# Patient Record
Sex: Male | Born: 1985
Health system: Southern US, Community
[De-identification: ages and names within clinical notes are randomized; demographics above are authoritative.]

## PROBLEM LIST (undated history)

## (undated) DIAGNOSIS — S32009A Unspecified fracture of unspecified lumbar vertebra, initial encounter for closed fracture: Secondary | ICD-10-CM

## (undated) HISTORY — DX: Unspecified fracture of unspecified lumbar vertebra, initial encounter for closed fracture: S32.009A

---
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
## 2007-12-20 ENCOUNTER — Ambulatory Visit: Payer: Self-pay | Admitting: Internal Medicine

## 2010-03-19 ENCOUNTER — Emergency Department: Payer: Self-pay | Admitting: Internal Medicine

## 2011-10-15 IMAGING — CR DG CHEST 1V PORT
1 series · 1 of 1 positions shown · non-contrast
Comparison: none

REASON FOR EXAM: sob
COMMENTS:

PROCEDURE:     DXR - DXR PORTABLE CHEST SINGLE VIEW  - March 19, 2010  [DATE]
RESULT:     Comparison: None

[view not recorded]
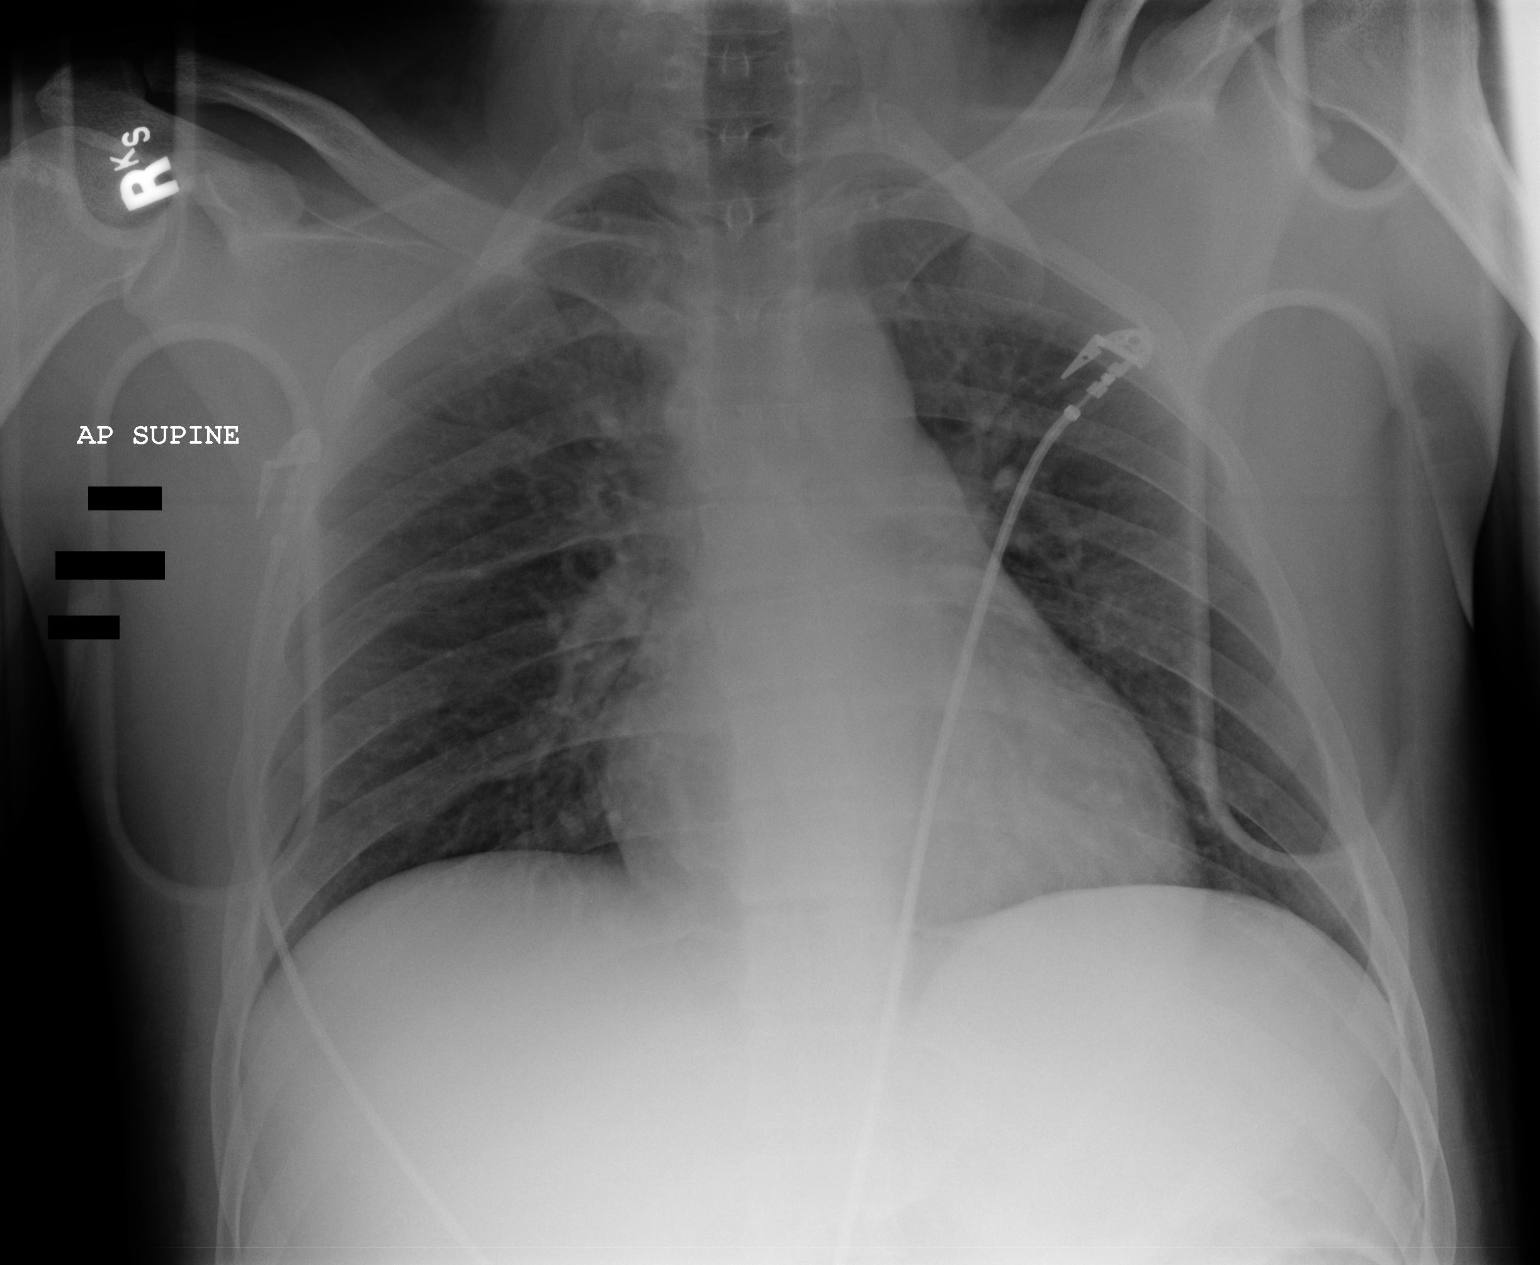

[1 of 1 positions shown; findings below may reference images not displayed]

FINDINGS: Single portable AP chest radiograph is provided. There is no focal
parenchymal opacity, pleural effusion, or pneumothorax. Normal
cardiomediastinal silhouette. The osseous structures are unremarkable.
IMPRESSION: No acute disease of the chest.

## 2011-10-15 IMAGING — CT CT CERVICAL SPINE WITHOUT CONTRAST
3 series · 16 of 35 positions shown, 19 images · non-contrast
Comparison: None

REASON FOR EXAM: fall neck apin
COMMENTS:

PROCEDURE:     CT  - CT CERVICAL SPINE WO  - March 19, 2010  [DATE]
RESULT:     Clinical Indication:
TECHNIQUE: Multiple axial CT images from the skull base to the mid vertebral
body of T1. obtained with sagittal and coronal reformatted images provided.

[Series 3: coronal · coronal · 0.32mm/px · 3 of 46 slices shown]
[im 10/46  bone]
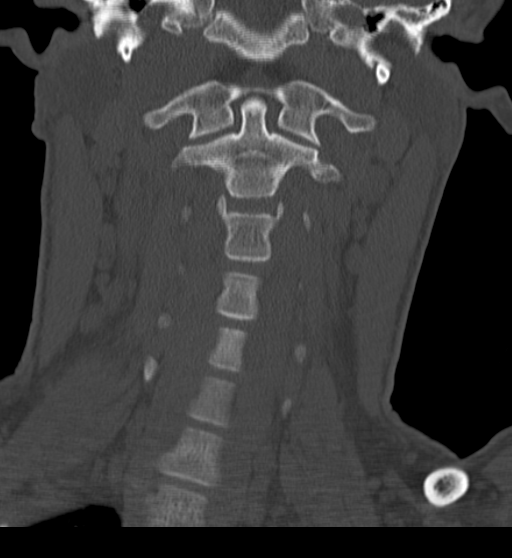
[im 19/46  bone]
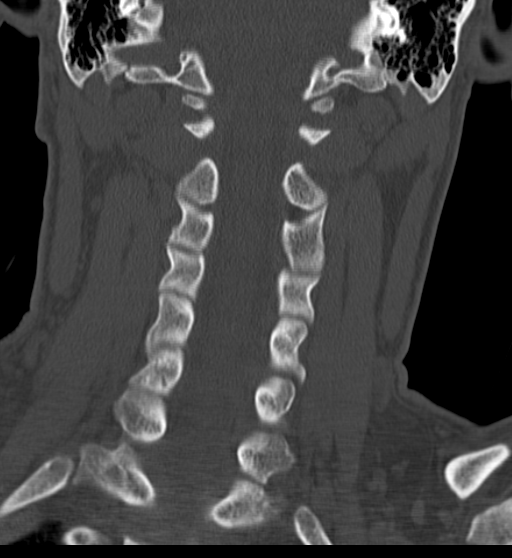
[im 28/46  bone]
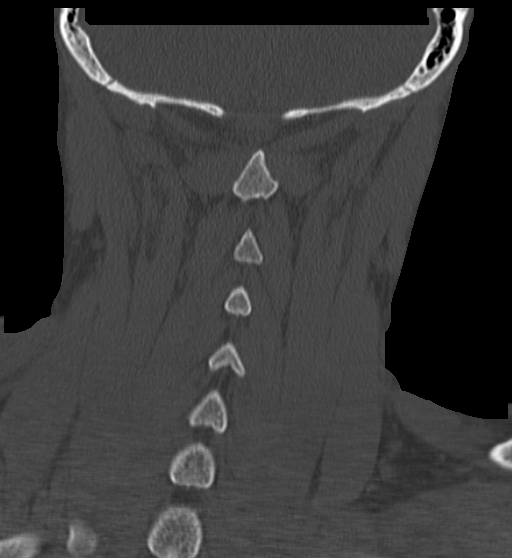

[Series 4: sagittal · sagittal · 0.36mm/px · 5 of 57 slices shown, 6 images]
[im 19/57  bone]
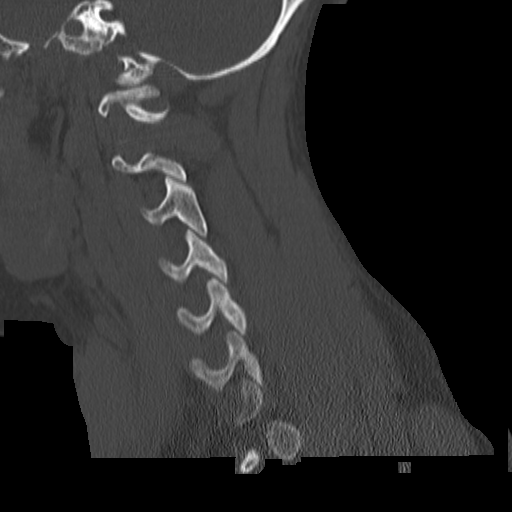
[im 24/57  bone]
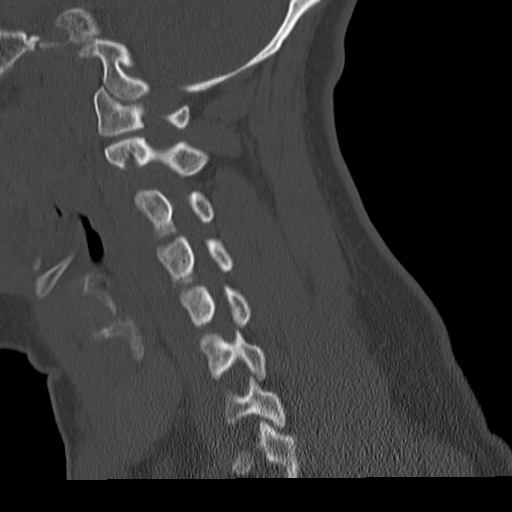
[im 29/57  bone]
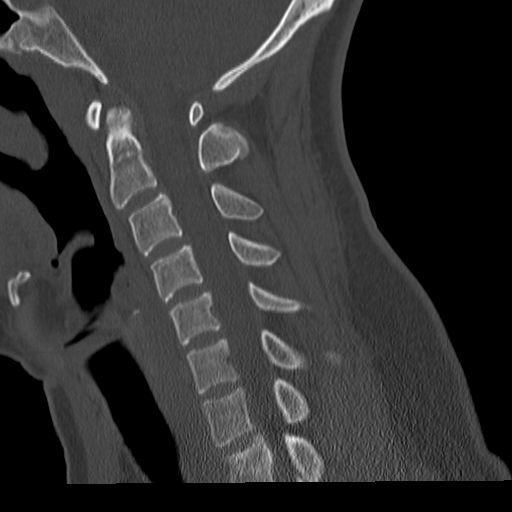
[im 33/57  soft-tissue]
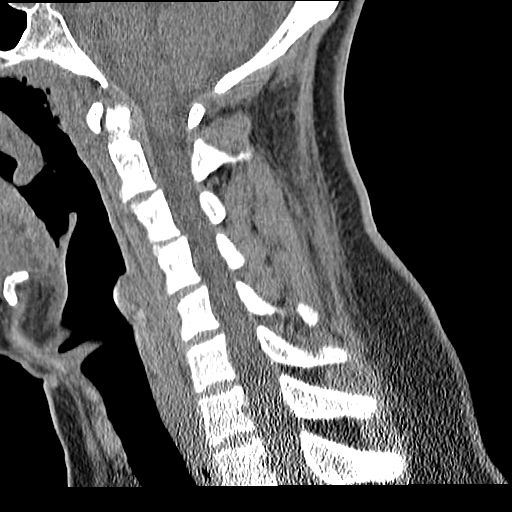
[im 33/57  bone]
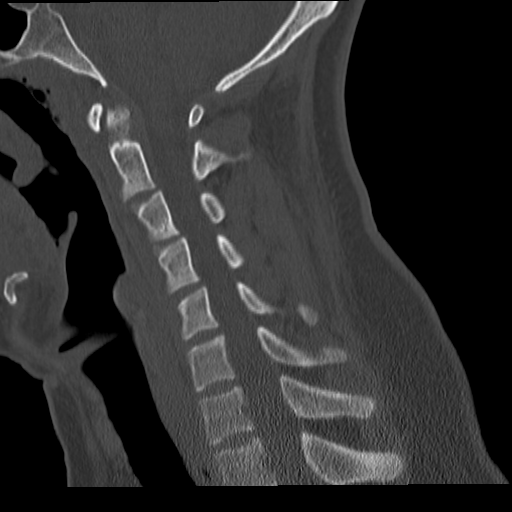
[im 38/57  bone]
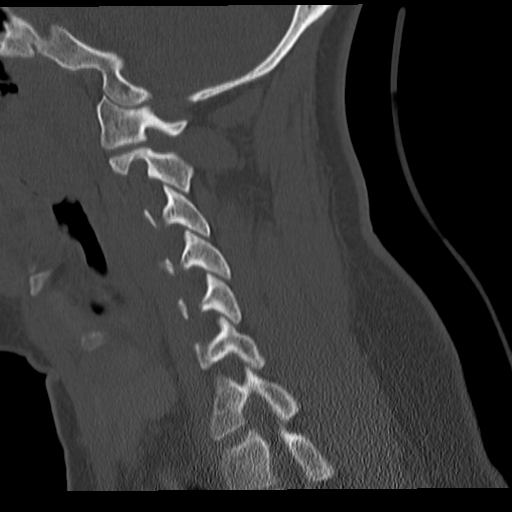

[Series 5: axial · axial · 0.29mm/px · z∈[-75,+64]mm · 8 of 84 slices shown, 10 images]
[im 7/84  soft-tissue]
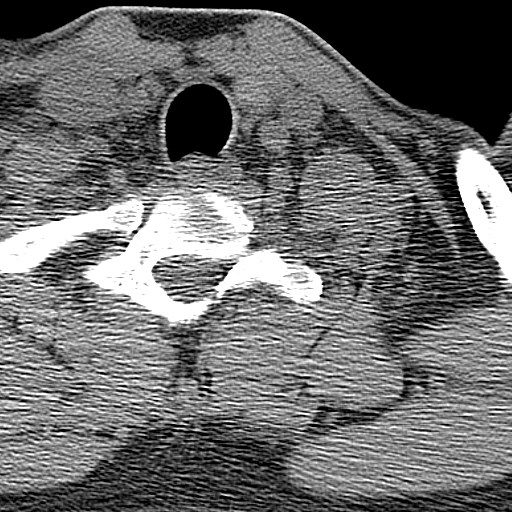
[im 7/84  bone]
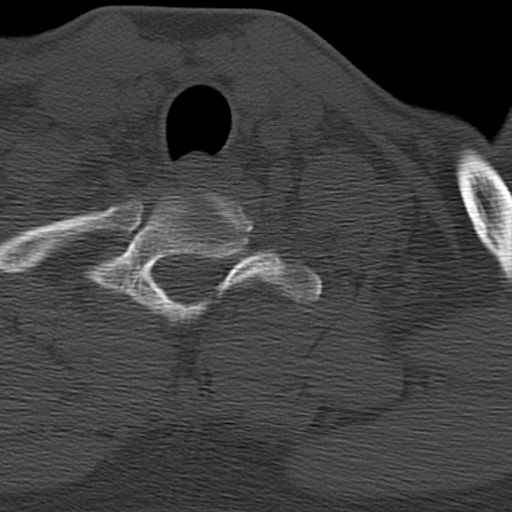
[im 20/84  bone]
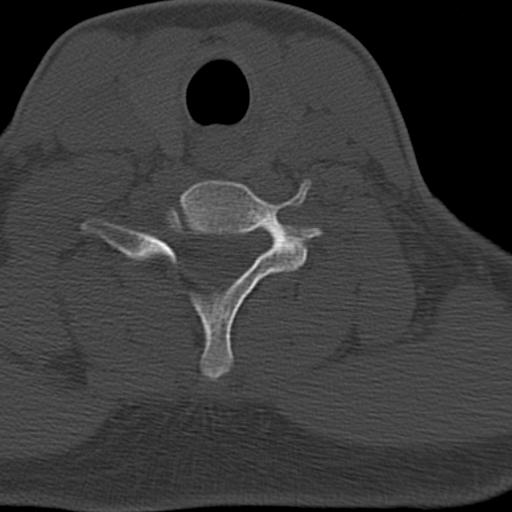
[im 26/84  bone]
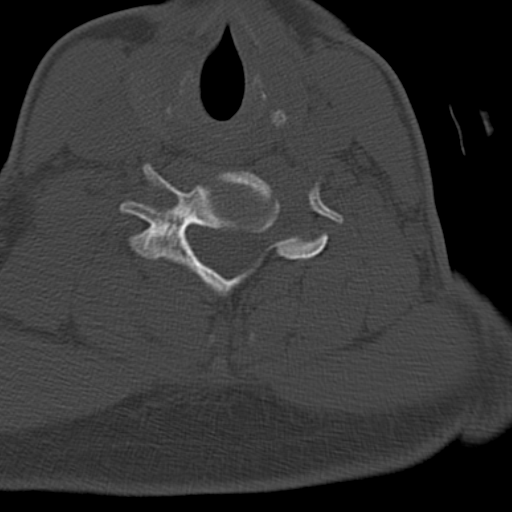
[im 39/84  bone]
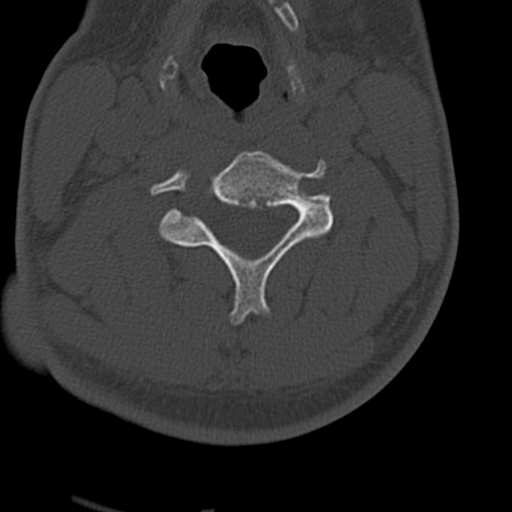
[im 45/84  soft-tissue]
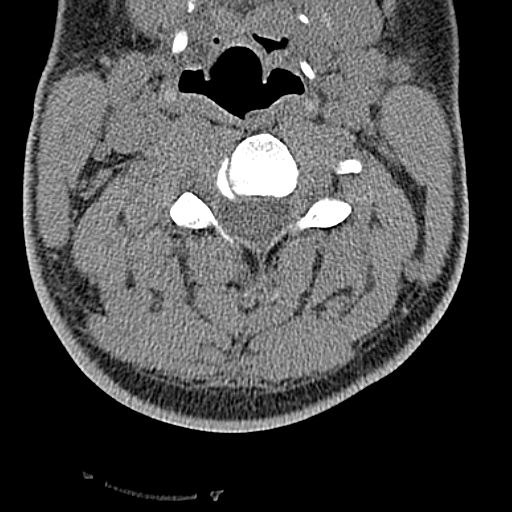
[im 45/84  bone]
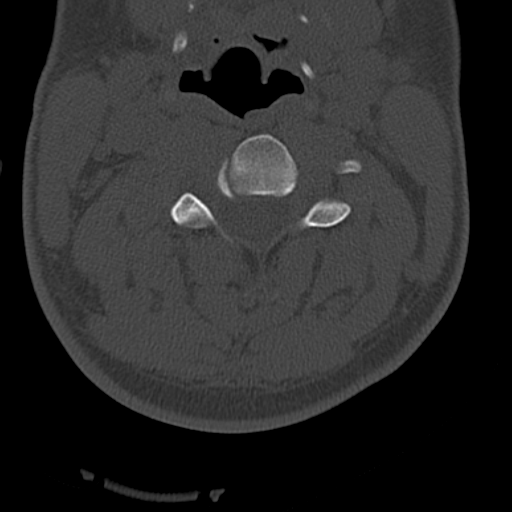
[im 58/84  bone]
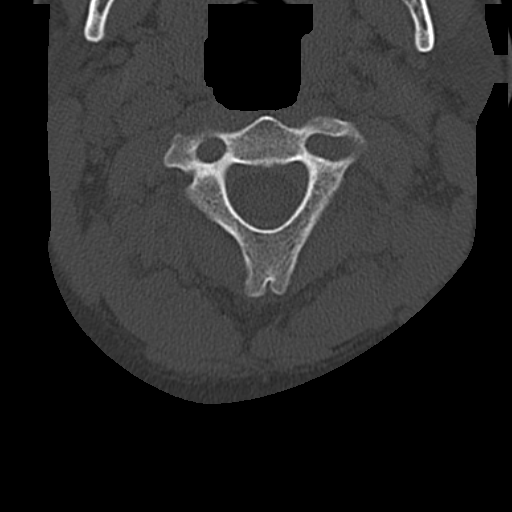
[im 64/84  bone]
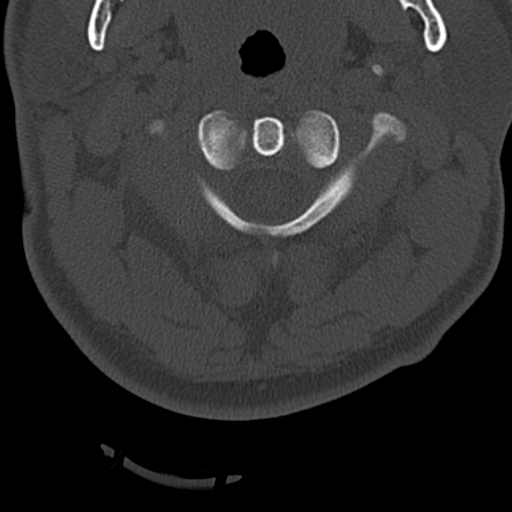
[im 77/84  bone]
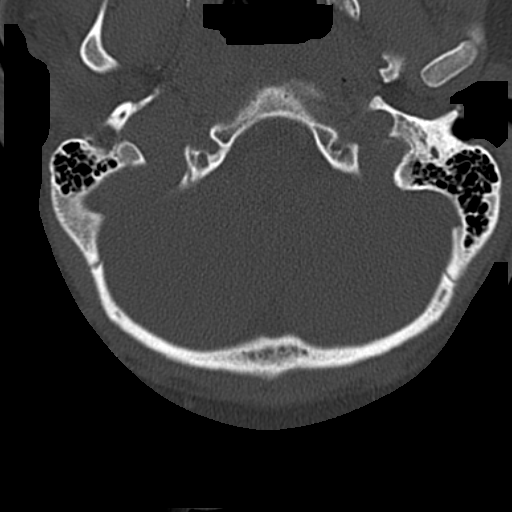

[16 of 35 positions shown; findings below may reference images not displayed]

FINDINGS: The alignment is anatomic. The vertebral body heights are maintained. There
is loss of the normal cervical lordosis with relative straightening which
may be secondary to muscle spasm. There is no acute fracture or static
listhesis. The prevertebral soft tissues are normal. The intraspinal soft
tissues are not fully imaged on this examination due to poor soft tissue
contrast, but there is no soft tissue gross abnormality.

The disc spaces are maintained.
IMPRESSION: 1. No acute osseous injury of the cervical spine.

2. Ligamentous injury is not evaluated. If there is high clinical concern
for ligamentous injury, consider MRI or flexion/extension radiographs as
clinically indicated and tolerated.

## 2012-12-18 DIAGNOSIS — S32009A Unspecified fracture of unspecified lumbar vertebra, initial encounter for closed fracture: Secondary | ICD-10-CM

## 2012-12-18 HISTORY — PX: BACK SURGERY: SHX140

## 2012-12-18 HISTORY — DX: Unspecified fracture of unspecified lumbar vertebra, initial encounter for closed fracture: S32.009A

## 2017-12-25 DIAGNOSIS — J22 Unspecified acute lower respiratory infection: Secondary | ICD-10-CM | POA: Diagnosis not present

## 2018-05-06 ENCOUNTER — Ambulatory Visit: Payer: BLUE CROSS/BLUE SHIELD | Admitting: Podiatry

## 2018-05-06 ENCOUNTER — Encounter: Payer: Self-pay | Admitting: Podiatry

## 2018-05-06 ENCOUNTER — Other Ambulatory Visit: Payer: Self-pay | Admitting: Podiatry

## 2018-05-06 ENCOUNTER — Ambulatory Visit (INDEPENDENT_AMBULATORY_CARE_PROVIDER_SITE_OTHER): Payer: BLUE CROSS/BLUE SHIELD

## 2018-05-06 VITALS — BP 136/88 | HR 83 | Resp 16

## 2018-05-06 DIAGNOSIS — M722 Plantar fascial fibromatosis: Secondary | ICD-10-CM | POA: Diagnosis not present

## 2018-05-06 DIAGNOSIS — M79671 Pain in right foot: Secondary | ICD-10-CM

## 2018-05-06 DIAGNOSIS — M79672 Pain in left foot: Principal | ICD-10-CM

## 2018-05-06 MED ORDER — PREDNISONE 10 MG PO TABS: ORAL_TABLET | ORAL | 0 refills | Status: DC

## 2018-05-06 MED ORDER — TRIAMCINOLONE ACETONIDE 10 MG/ML IJ SUSP
10.0000 mg | Freq: Once | INTRAMUSCULAR | Status: AC
  Administered 2018-05-06: 10 mg

## 2018-05-06 NOTE — Progress Notes (Signed)
   Subjective:    Patient ID: Kevin Mendoza, male    DOB: 1986-06-06, 32 y.o.   MRN: [id number]  HPI    Review of Systems  All other systems reviewed and are negative.      Objective:   Physical Exam        Assessment & Plan:

## 2018-05-06 NOTE — Patient Instructions (Signed)

## 2018-05-20 ENCOUNTER — Ambulatory Visit: Payer: BLUE CROSS/BLUE SHIELD | Admitting: Podiatry

## 2018-05-20 ENCOUNTER — Encounter: Payer: Self-pay | Admitting: Podiatry

## 2018-05-20 DIAGNOSIS — M722 Plantar fascial fibromatosis: Secondary | ICD-10-CM

## 2018-05-20 MED ORDER — TRIAMCINOLONE ACETONIDE 10 MG/ML IJ SUSP
10.0000 mg | Freq: Once | INTRAMUSCULAR | Status: AC
  Administered 2018-05-20: 10 mg

## 2018-05-20 NOTE — Progress Notes (Signed)
Subjective:   Patient ID: Kevin Mendoza, male   DOB: 32 y.o.   MRN: [id number]   HPI Patient states the brace is helping and the injection seems to help but I still have pain and I am so active in my job and I do have to bend down a lot and I have to squat quite a bit   ROS      Objective:  Physical Exam  Neurovascular status with patient's plantar heels still sore upon deep palpation but improved from previous visit with moderate depression of the arch noted     Assessment:  Continued acute plantar fasciitis bilateral     Plan:  H&P condition reviewed and today I reinjected the plantar fascial bilateral 3 mg Kenalog 5 mg Xylocaine and I advised this patient on anti-inflammatories and I went ahead and I scanned for customized orthotic devices to reduce the plantar pressure on the heels.  Reappoint when returned or earlier if needed

## 2018-07-08 ENCOUNTER — Ambulatory Visit: Payer: BLUE CROSS/BLUE SHIELD | Admitting: Family Medicine

## 2018-07-08 ENCOUNTER — Encounter: Payer: Self-pay | Admitting: Family Medicine

## 2018-07-08 VITALS — BP 124/82 | HR 82 | Resp 16 | Ht 70.0 in | Wt 237.0 lb

## 2018-07-08 DIAGNOSIS — L2082 Flexural eczema: Secondary | ICD-10-CM

## 2018-07-08 DIAGNOSIS — Z6833 Body mass index (BMI) 33.0-33.9, adult: Secondary | ICD-10-CM | POA: Diagnosis not present

## 2018-07-08 DIAGNOSIS — Z7689 Persons encountering health services in other specified circumstances: Secondary | ICD-10-CM | POA: Diagnosis not present

## 2018-07-08 DIAGNOSIS — R5383 Other fatigue: Secondary | ICD-10-CM | POA: Diagnosis not present

## 2018-07-08 DIAGNOSIS — E6609 Other obesity due to excess calories: Secondary | ICD-10-CM | POA: Diagnosis not present

## 2018-07-08 MED ORDER — TRIAMCINOLONE ACETONIDE 0.1 % EX CREA: 1.0000 "application " | TOPICAL_CREAM | Freq: Two times a day (BID) | CUTANEOUS | 1 refills | Status: DC

## 2018-07-08 NOTE — Progress Notes (Signed)
Name: Kevin Mendoza   MRN: [id number]    DOB: 30-Mar-1986   Date:07/08/2018       Progress Note  Subjective  Chief Complaint  Chief Complaint  Patient presents with  . Establish Care    Patient is a 32 year old male who presents to establish care with primary care physician.. The patient reports the following problems: none. Health maintenance has been reviewed up to date.   No problem-specific Assessment & Plan notes found for this encounter.   Past Medical History:  Diagnosis Date  . Fracture of lumbar spine (HCC) 2014    Past Surgical History:  Procedure Laterality Date  . BACK SURGERY  2014    Family History  Problem Relation Age of Onset  . Obesity Mother   . Heart failure Father   . Heart failure Brother     Social History   Socioeconomic History  . Marital status: Single    Spouse name: Not on file  . Number of children: Not on file  . Years of education: Not on file  . Highest education level: Not on file  Occupational History  . Not on file  Social Needs  . Financial resource strain: Not on file  . Food insecurity:    Worry: Not on file    Inability: Not on file  . Transportation needs:    Medical: Not on file    Non-medical: Not on file  Tobacco Use  . Smoking status: Former Smoker    Types: Cigarettes  . Smokeless tobacco: Never Used  Substance and Sexual Activity  . Alcohol use: Yes    Comment: occassionally   . Drug use: Never  . Sexual activity: Not on file  Lifestyle  . Physical activity:    Days per week: Not on file    Minutes per session: Not on file  . Stress: Not on file  Relationships  . Social connections:    Talks on phone: Not on file    Gets together: Not on file    Attends religious service: Not on file    Active member of club or organization: Not on file    Attends meetings of clubs or organizations: Not on file    Relationship status: Not on file  . Intimate partner violence:    Fear of current or ex partner: Not  on file    Emotionally abused: Not on file    Physically abused: Not on file    Forced sexual activity: Not on file  Other Topics Concern  . Not on file  Social History Narrative  . Not on file    Allergies  Allergen Reactions  . Amoxicillin     Outpatient Medications Prior to Visit  Medication Sig Dispense Refill  . predniSONE (DELTASONE) 10 MG tablet 12 day tapering dose 48 tablet 0   No facility-administered medications prior to visit.     Review of Systems  Constitutional: Positive for malaise/fatigue. Negative for chills, fever and weight loss.  HENT: Negative for congestion, ear discharge, ear pain, sinus pain and sore throat.   Eyes: Negative for blurred vision, photophobia, pain and discharge.  Respiratory: Negative for cough, hemoptysis, sputum production, shortness of breath and wheezing.   Cardiovascular: Negative for chest pain, palpitations, orthopnea, claudication, leg swelling and PND.  Gastrointestinal: Negative for abdominal pain, blood in stool, constipation, diarrhea, heartburn, melena and nausea.  Genitourinary: Negative for dysuria, frequency, hematuria and urgency.  Musculoskeletal: Negative for back pain, falls, joint pain,  myalgias and neck pain.  Skin: Negative for rash.       eczema  Neurological: Negative for dizziness, tingling, sensory change, focal weakness and headaches.  Endo/Heme/Allergies: Negative for environmental allergies and polydipsia. Does not bruise/bleed easily.  Psychiatric/Behavioral: Negative for depression and suicidal ideas. The patient is not nervous/anxious and does not have insomnia.        Libido     Objective  Vitals:   07/08/18 1436  BP: 124/82  Pulse: 82  Resp: 16  SpO2: 98%  Weight: 237 lb (107.5 kg)  Height: 5\' 10"  (1.778 m)    Physical Exam  Constitutional: He is oriented to person, place, and time.  HENT:  Head: Normocephalic.  Right Ear: Hearing, external ear and ear canal normal. Right ear retracted  TM: triamcin. A middle ear effusion is present.  Left Ear: Hearing, external ear and ear canal normal. A middle ear effusion is present.  Nose: Nose normal.  Mouth/Throat: Oropharynx is clear and moist.  Eyes: Pupils are equal, round, and reactive to light. Conjunctivae and EOM are normal. Right eye exhibits no discharge. Left eye exhibits no discharge. No scleral icterus.  Neck: Normal range of motion. Neck supple. Normal carotid pulses, no hepatojugular reflux and no JVD present. Carotid bruit is not present. No tracheal deviation present. Thyromegaly present. No thyroid mass present.  Cardiovascular: Normal rate, regular rhythm, S1 normal, S2 normal and intact distal pulses. Exam reveals no gallop, no S3, no S4 and no friction rub.  Murmur heard.  Systolic murmur is present with a grade of 1/6.  No diastolic murmur is present. Pulmonary/Chest: Breath sounds normal. No respiratory distress. He has no wheezes. He has no rales.  Abdominal: Soft. Bowel sounds are normal. He exhibits no mass. There is no hepatosplenomegaly. There is no tenderness. There is no rebound, no guarding and no CVA tenderness.  Musculoskeletal: Normal range of motion. He exhibits no edema or tenderness.  Lymphadenopathy:    He has no cervical adenopathy.  Neurological: He is alert and oriented to person, place, and time. He has normal strength and normal reflexes. No cranial nerve deficit.  Skin: Skin is warm. No rash noted.  Nursing note and vitals reviewed.     Assessment & Plan  Problem List Items Addressed This Visit    None    Visit Diagnoses    Establishing care with new doctor, encounter for    -  Primary   Patient to establish care.   Fatigue, unspecified type       Patient withconcern of family hx of cardiomyopathy. Concern of fatigue and decreased libido. Will check cbc,renal,lipid, tsh, and testosterone.   Relevant Orders   TSH   Renal Function Panel   Testosterone   CBC with  Differential/Platelet   Class 1 obesity due to excess calories without serious comorbidity with body mass index (BMI) of 33.0 to 33.9 in adult       Discussed weight loss. CPE in 2-3 months   Relevant Orders   TSH   Lipid panel   Flexural eczema       Recurrent Will prescibe triamcinolone 0.1% bid    Relevant Medications   triamcinolone cream (KENALOG) 0.1 %      Meds ordered this encounter  Medications  . triamcinolone cream (KENALOG) 0.1 %    Sig: Apply 1 application topically 2 (two) times daily.    Dispense:  453.6 g    Refill:  1      Dr. Jennette Kettle  [facility]  07/08/18

## 2018-07-09 DIAGNOSIS — Z6833 Body mass index (BMI) 33.0-33.9, adult: Secondary | ICD-10-CM | POA: Diagnosis not present

## 2018-07-09 DIAGNOSIS — E6609 Other obesity due to excess calories: Secondary | ICD-10-CM | POA: Diagnosis not present

## 2018-07-09 DIAGNOSIS — R5383 Other fatigue: Secondary | ICD-10-CM | POA: Diagnosis not present

## 2018-07-10 LAB — CBC WITH DIFFERENTIAL/PLATELET
BASOS ABS: 0 10*3/uL (ref 0.0–0.2)
BASOS: 0 %
EOS (ABSOLUTE): 0.1 10*3/uL (ref 0.0–0.4)
Eos: 2 %
Hematocrit: 44.7 % (ref 37.5–51.0)
Hemoglobin: 15.1 g/dL (ref 13.0–17.7)
IMMATURE GRANULOCYTES: 0 %
Immature Grans (Abs): 0 10*3/uL (ref 0.0–0.1)
Lymphocytes Absolute: 2.2 10*3/uL (ref 0.7–3.1)
Lymphs: 28 %
MCH: 31.3 pg (ref 26.6–33.0)
MCHC: 33.8 g/dL (ref 31.5–35.7)
MCV: 93 fL (ref 79–97)
MONOS ABS: 0.6 10*3/uL (ref 0.1–0.9)
Monocytes: 8 %
NEUTROS PCT: 62 %
Neutrophils Absolute: 4.9 10*3/uL (ref 1.4–7.0)
PLATELETS: 238 10*3/uL (ref 150–450)
RBC: 4.83 x10E6/uL (ref 4.14–5.80)
RDW: 12.4 % (ref 12.3–15.4)
WBC: 7.9 10*3/uL (ref 3.4–10.8)

## 2018-07-10 LAB — TSH: TSH: 1.25 u[IU]/mL (ref 0.450–4.500)

## 2018-07-10 LAB — LIPID PANEL
CHOL/HDL RATIO: 5 ratio (ref 0.0–5.0)
CHOLESTEROL TOTAL: 179 mg/dL (ref 100–199)
HDL: 36 mg/dL — ABNORMAL LOW (ref >39)
LDL Calculated: 119 mg/dL — ABNORMAL HIGH (ref 0–99)
Triglycerides: 122 mg/dL (ref 0–149)
VLDL Cholesterol Cal: 24 mg/dL (ref 5–40)

## 2018-07-10 LAB — RENAL FUNCTION PANEL
Albumin: 4.7 g/dL (ref 3.5–5.5)
BUN/Creatinine Ratio: 12 (ref 9–20)
BUN: 11 mg/dL (ref 6–20)
CHLORIDE: 102 mmol/L (ref 96–106)
CO2: 21 mmol/L (ref 20–29)
Calcium: 9.4 mg/dL (ref 8.7–10.2)
Creatinine, Ser: 0.94 mg/dL (ref 0.76–1.27)
GFR calc Af Amer: 124 mL/min/{1.73_m2} (ref >59)
GFR calc non Af Amer: 108 mL/min/{1.73_m2} (ref >59)
Glucose: 86 mg/dL (ref 65–99)
Phosphorus: 3.1 mg/dL (ref 2.5–4.5)
Potassium: 4.6 mmol/L (ref 3.5–5.2)
Sodium: 141 mmol/L (ref 134–144)

## 2018-07-10 LAB — TESTOSTERONE: TESTOSTERONE: 457 ng/dL (ref 264–916)

## 2018-08-28 ENCOUNTER — Ambulatory Visit (INDEPENDENT_AMBULATORY_CARE_PROVIDER_SITE_OTHER): Payer: BLUE CROSS/BLUE SHIELD | Admitting: Family Medicine

## 2018-08-28 ENCOUNTER — Encounter: Payer: Self-pay | Admitting: Family Medicine

## 2018-08-28 VITALS — BP 130/82 | HR 64 | Ht 70.0 in | Wt 235.0 lb

## 2018-08-28 DIAGNOSIS — Z8249 Family history of ischemic heart disease and other diseases of the circulatory system: Secondary | ICD-10-CM | POA: Diagnosis not present

## 2018-08-28 DIAGNOSIS — Z1211 Encounter for screening for malignant neoplasm of colon: Secondary | ICD-10-CM

## 2018-08-28 DIAGNOSIS — R5383 Other fatigue: Secondary | ICD-10-CM

## 2018-08-28 DIAGNOSIS — K219 Gastro-esophageal reflux disease without esophagitis: Secondary | ICD-10-CM | POA: Diagnosis not present

## 2018-08-28 DIAGNOSIS — Z0000 Encounter for general adult medical examination without abnormal findings: Secondary | ICD-10-CM | POA: Diagnosis not present

## 2018-08-28 LAB — HEMOCCULT GUIAC POC 1CARD (OFFICE): Fecal Occult Blood, POC: NEGATIVE

## 2018-08-28 NOTE — Progress Notes (Signed)
Name: Kevin Mendoza   MRN: [id number]    DOB: 10-16-1986   Date:08/28/2018       Progress Note  Subjective  Chief Complaint  Chief Complaint  Patient presents with  . Annual Exam    referral to cardiology?- dad and brother had CHF. No issues that he is aware of.  . Gastroesophageal Reflux    takes TUMS for relief- helps    Patient is a 32  year old male who presents for a comprehensive physical exam. The patient reports the following problems: Heart history concern. Health maintenance has been reviewed up to date.   No problem-specific Assessment & Plan notes found for this encounter.   Past Medical History:  Diagnosis Date  . Fracture of lumbar spine (HCC) 2014    Past Surgical History:  Procedure Laterality Date  . BACK SURGERY  2014    Family History  Problem Relation Age of Onset  . Obesity Mother   . Heart failure Father   . Heart failure Brother     Social History   Socioeconomic History  . Marital status: Single    Spouse name: Not on file  . Number of children: Not on file  . Years of education: Not on file  . Highest education level: Not on file  Occupational History  . Not on file  Social Needs  . Financial resource strain: Not on file  . Food insecurity:    Worry: Not on file    Inability: Not on file  . Transportation needs:    Medical: Not on file    Non-medical: Not on file  Tobacco Use  . Smoking status: Former Smoker    Types: Cigarettes  . Smokeless tobacco: Never Used  Substance and Sexual Activity  . Alcohol use: Yes    Comment: occassionally   . Drug use: Never  . Sexual activity: Not on file  Lifestyle  . Physical activity:    Days per week: Not on file    Minutes per session: Not on file  . Stress: Not on file  Relationships  . Social connections:    Talks on phone: Not on file    Gets together: Not on file    Attends religious service: Not on file    Active member of club or organization: Not on file    Attends meetings  of clubs or organizations: Not on file    Relationship status: Not on file  . Intimate partner violence:    Fear of current or ex partner: Not on file    Emotionally abused: Not on file    Physically abused: Not on file    Forced sexual activity: Not on file  Other Topics Concern  . Not on file  Social History Narrative  . Not on file    Allergies  Allergen Reactions  . Amoxicillin     Outpatient Medications Prior to Visit  Medication Sig Dispense Refill  . triamcinolone cream (KENALOG) 0.1 % Apply 1 application topically 2 (two) times daily. 453.6 g 1   No facility-administered medications prior to visit.     Review of Systems  Constitutional: Negative for chills, fever, malaise/fatigue and weight loss.  HENT: Negative for ear discharge, ear pain and sore throat.   Eyes: Negative for blurred vision.  Respiratory: Negative for cough, hemoptysis, sputum production, shortness of breath and wheezing.   Cardiovascular: Negative for chest pain, palpitations, orthopnea, claudication, leg swelling and PND.  Gastrointestinal: Negative for abdominal pain, blood  in stool, constipation, diarrhea, heartburn, melena, nausea and vomiting.  Genitourinary: Negative for dysuria, flank pain, frequency, hematuria and urgency.       No nocturia  Musculoskeletal: Negative for back pain, joint pain, myalgias and neck pain.  Skin: Negative for rash.  Neurological: Negative for dizziness, tingling, sensory change, speech change, focal weakness and headaches.  Endo/Heme/Allergies: Negative for environmental allergies and polydipsia. Does not bruise/bleed easily.  Psychiatric/Behavioral: Negative for depression and suicidal ideas. The patient is not nervous/anxious and does not have insomnia.      Objective  Vitals:   08/28/18 0838  BP: 130/82  Pulse: 64  Weight: 235 lb (106.6 kg)  Height: 5\' 10"  (1.778 m)    Physical Exam  Constitutional: He is oriented to person, place, and time. He  appears well-developed and well-nourished.  HENT:  Head: Normocephalic.  Right Ear: Hearing, tympanic membrane, external ear and ear canal normal.  Left Ear: Hearing, tympanic membrane, external ear and ear canal normal.  Nose: Nose normal. No mucosal edema.  Mouth/Throat: Uvula is midline and oropharynx is clear and moist. No oropharyngeal exudate, posterior oropharyngeal edema or posterior oropharyngeal erythema.  Eyes: Pupils are equal, round, and reactive to light. Conjunctivae, EOM and lids are normal. Right eye exhibits no discharge. Left eye exhibits no discharge. No scleral icterus.  Fundoscopic exam:      The right eye shows no arteriolar narrowing and no AV nicking.       The left eye shows no arteriolar narrowing and no AV nicking.  Neck: Trachea normal, normal range of motion and full passive range of motion without pain. Neck supple. Normal carotid pulses, no hepatojugular reflux and no JVD present. Carotid bruit is not present. No tracheal deviation present. No thyroid mass and no thyromegaly present.  Cardiovascular: Normal rate, regular rhythm, S1 normal, S2 normal, normal heart sounds and intact distal pulses. PMI is not displaced. Exam reveals no gallop, no S3, no S4, no distant heart sounds, no friction rub and no decreased pulses.  No murmur heard.  No systolic murmur is present.  No diastolic murmur is present. Pulses:      Carotid pulses are 2+ on the right side, and 2+ on the left side.      Radial pulses are 2+ on the right side, and 2+ on the left side.       Femoral pulses are 2+ on the right side, and 2+ on the left side.      Popliteal pulses are 2+ on the right side, and 2+ on the left side.       Dorsalis pedis pulses are 2+ on the right side, and 2+ on the left side.       Posterior tibial pulses are 2+ on the right side, and 2+ on the left side.  Pulmonary/Chest: Effort normal and breath sounds normal. No stridor. No respiratory distress. He has no decreased  breath sounds. He has no wheezes. He has no rales. He exhibits no mass. Right breast exhibits no mass. Left breast exhibits no mass.  Abdominal: Soft. Normal aorta and bowel sounds are normal. He exhibits no pulsatile liver, no ascites and no mass. There is no hepatosplenomegaly, splenomegaly or hepatomegaly. There is no tenderness. There is no rebound, no guarding and no CVA tenderness. No hernia. Hernia confirmed negative in the right inguinal area and confirmed negative in the left inguinal area.  Genitourinary: Rectum normal, prostate normal, testes normal and penis normal. Rectal exam shows guaiac negative stool.  Right testis shows no mass, no swelling and no tenderness. Left testis shows no mass, no swelling and no tenderness.  Musculoskeletal: Normal range of motion. He exhibits no edema or tenderness.       Cervical back: Normal.       Thoracic back: Normal.       Lumbar back: Normal.  Lymphadenopathy:       Head (right side): No submental and no submandibular adenopathy present.       Head (left side): No submental and no submandibular adenopathy present.    He has no cervical adenopathy.       Right cervical: No superficial cervical, no deep cervical and no posterior cervical adenopathy present.      Left cervical: No superficial cervical, no deep cervical and no posterior cervical adenopathy present.  Neurological: He is alert and oriented to person, place, and time. He has normal strength and normal reflexes. No cranial nerve deficit or sensory deficit.  Reflex Scores:      Tricep reflexes are 2+ on the right side and 2+ on the left side.      Bicep reflexes are 2+ on the right side and 2+ on the left side.      Brachioradialis reflexes are 2+ on the right side and 2+ on the left side.      Patellar reflexes are 2+ on the right side and 2+ on the left side.      Achilles reflexes are 2+ on the right side and 2+ on the left side. Skin: Skin is warm, dry and intact. No rash noted.   Psychiatric: He has a normal mood and affect. His speech is normal and behavior is normal.  Nursing note and vitals reviewed.     Assessment & Plan  Problem List Items Addressed This Visit    None    Visit Diagnoses    Annual physical exam    -  Primary   no abnormalities on physical exam   Relevant Orders   POCT occult blood stool (Completed)   Gastroesophageal reflux disease, esophagitis presence not specified       wishes to continue with TUMS   Family history of first-degree relative with cardiomyopathy       sched appt with cardio/ spoke to [place] concerning pt   Fatigue, unspecified type       follow up with Dr Gwen Pounds labs were normal at last visit   Colon cancer screening       Relevant Orders   POCT occult blood stool (Completed)      No orders of the defined types were placed in this encounter.     Dr. [facility]  08/28/18

## 2018-08-28 NOTE — Patient Instructions (Signed)
Mediterranean Diet A Mediterranean diet refers to food and lifestyle choices that are based on the traditions of countries located on the [place]. This way of eating has been shown to help prevent certain conditions and improve outcomes for people who have chronic diseases, like kidney disease and heart disease. What are tips for following this plan? Lifestyle  Cook and eat meals together with your family, when possible.  Drink enough fluid to keep your urine clear or pale yellow.  Be physically active every day. This includes: ? Aerobic exercise like running or swimming. ? Leisure activities like gardening, walking, or housework.  Get 7-8 hours of sleep each night.  If recommended by your health care provider, drink red wine in moderation. This means 1 glass a day for nonpregnant women and 2 glasses a day for men. A glass of wine equals 5 oz (150 mL). Reading food labels  Check the serving size of packaged foods. For foods such as rice and pasta, the serving size refers to the amount of cooked product, not dry.  Check the total fat in packaged foods. Avoid foods that have saturated fat or trans fats.  Check the ingredients list for added sugars, such as corn syrup. Shopping  At the grocery store, buy most of your food from the areas near the walls of the store. This includes: ? Fresh fruits and vegetables (produce). ? Grains, beans, nuts, and seeds. Some of these may be available in unpackaged forms or large amounts (in bulk). ? Fresh seafood. ? Poultry and eggs. ? Low-fat dairy products.  Buy whole ingredients instead of prepackaged foods.  Buy fresh fruits and vegetables in-season from local farmers markets.  Buy frozen fruits and vegetables in resealable bags.  If you do not have access to quality fresh seafood, buy precooked frozen shrimp or canned fish, such as tuna, salmon, or sardines.  Buy small amounts of raw or cooked vegetables, salads, or olives from the  deli or salad bar at your store.  Stock your pantry so you always have certain foods on hand, such as olive oil, canned tuna, canned tomatoes, rice, pasta, and beans. Cooking  Cook foods with extra-virgin olive oil instead of using butter or other vegetable oils.  Have meat as a side dish, and have vegetables or grains as your main dish. This means having meat in small portions or adding small amounts of meat to foods like pasta or stew.  Use beans or vegetables instead of meat in common dishes like chili or lasagna.  Experiment with different cooking methods. Try roasting or broiling vegetables instead of steaming or sauteing them.  Add frozen vegetables to soups, stews, pasta, or rice.  Add nuts or seeds for added healthy fat at each meal. You can add these to yogurt, salads, or vegetable dishes.  Marinate fish or vegetables using olive oil, lemon juice, garlic, and fresh herbs. Meal planning  Plan to eat 1 vegetarian meal one day each week. Try to work up to 2 vegetarian meals, if possible.  Eat seafood 2 or more times a week.  Have healthy snacks readily available, such as: ? Vegetable sticks with hummus. ? Greek yogurt. ? Fruit and nut trail mix.  Eat balanced meals throughout the week. This includes: ? Fruit: 2-3 servings a day ? Vegetables: 4-5 servings a day ? Low-fat dairy: 2 servings a day ? Fish, poultry, or lean meat: 1 serving a day ? Beans and legumes: 2 or more servings a week ? Nuts   and seeds: 1-2 servings a day ? Whole grains: 6-8 servings a day ? Extra-virgin olive oil: 3-4 servings a day  Limit red meat and sweets to only a few servings a month What are my food choices?  Mediterranean diet ? Recommended ? Grains: Whole-grain pasta. Brown rice. Bulgar wheat. Polenta. Couscous. Whole-wheat bread. Oatmeal. Quinoa. ? Vegetables: Artichokes. Beets. Broccoli. Cabbage. Carrots. Eggplant. Green beans. Chard. Kale. Spinach. Onions. Leeks. Peas. Squash.  Tomatoes. Peppers. Radishes. ? Fruits: Apples. Apricots. Avocado. Berries. Bananas. Cherries. Dates. Figs. Grapes. Lemons. Melon. Oranges. Peaches. Plums. Pomegranate. ? Meats and other protein foods: Beans. Almonds. Sunflower seeds. Pine nuts. Peanuts. Cod. Salmon. Scallops. Shrimp. Tuna. Tilapia. Clams. Oysters. Eggs. ? Dairy: Low-fat milk. Cheese. Greek yogurt. ? Beverages: Water. Red wine. Herbal tea. ? Fats and oils: Extra virgin olive oil. Avocado oil. Grape seed oil. ? Sweets and desserts: Greek yogurt with honey. Baked apples. Poached pears. Trail mix. ? Seasoning and other foods: Basil. Cilantro. Coriander. Cumin. Mint. Parsley. Sage. Rosemary. Tarragon. Garlic. Oregano. Thyme. Pepper. Balsalmic vinegar. Tahini. Hummus. Tomato sauce. Olives. Mushrooms. ? Limit these ? Grains: Prepackaged pasta or rice dishes. Prepackaged cereal with added sugar. ? Vegetables: Deep fried potatoes (french fries). ? Fruits: Fruit canned in syrup. ? Meats and other protein foods: Beef. Pork. Lamb. Poultry with skin. Hot dogs. Bacon. ? Dairy: Ice cream. Sour cream. Whole milk. ? Beverages: Juice. Sugar-sweetened soft drinks. Beer. Liquor and spirits. ? Fats and oils: Butter. Canola oil. Vegetable oil. Beef fat (tallow). Lard. ? Sweets and desserts: Cookies. Cakes. Pies. Candy. ? Seasoning and other foods: Mayonnaise. Premade sauces and marinades. ? The items listed may not be a complete list. Talk with your dietitian about what dietary choices are right for you. Summary  The Mediterranean diet includes both food and lifestyle choices.  Eat a variety of fresh fruits and vegetables, beans, nuts, seeds, and whole grains.  Limit the amount of red meat and sweets that you eat.  Talk with your health care provider about whether it is safe for you to drink red wine in moderation. This means 1 glass a day for nonpregnant women and 2 glasses a day for men. A glass of wine equals 5 oz (150 mL). This information  is not intended to replace advice given to you by your health care provider. Make sure you discuss any questions you have with your health care provider. Document Released: 07/27/2016 Document Revised: 08/29/2016 Document Reviewed: 07/27/2016 Elsevier Interactive Patient Education  2018 [employer]  

## 2019-01-17 ENCOUNTER — Other Ambulatory Visit: Payer: Self-pay

## 2019-01-17 DIAGNOSIS — Z8249 Family history of ischemic heart disease and other diseases of the circulatory system: Secondary | ICD-10-CM

## 2019-01-17 NOTE — Progress Notes (Unsigned)
Sent referral to Dr Kirke CorinArida

## 2019-01-28 DIAGNOSIS — K219 Gastro-esophageal reflux disease without esophagitis: Secondary | ICD-10-CM | POA: Diagnosis not present

## 2019-01-28 DIAGNOSIS — Z6833 Body mass index (BMI) 33.0-33.9, adult: Secondary | ICD-10-CM | POA: Diagnosis not present

## 2019-01-28 DIAGNOSIS — R635 Abnormal weight gain: Secondary | ICD-10-CM | POA: Diagnosis not present

## 2019-01-28 DIAGNOSIS — M722 Plantar fascial fibromatosis: Secondary | ICD-10-CM | POA: Diagnosis not present

## 2019-03-31 ENCOUNTER — Telehealth: Payer: Self-pay

## 2019-03-31 NOTE — Telephone Encounter (Signed)
Pt called stating he had not heard from cardio- scheduled appt for tomorrow 04/01/2019 @ 1:30 in [place] Dr Gwen Pounds- pt notified to bring, ins card, copay and ID as well as all meds taking

## 2019-04-01 DIAGNOSIS — I429 Cardiomyopathy, unspecified: Secondary | ICD-10-CM | POA: Diagnosis not present

## 2019-04-14 DIAGNOSIS — I341 Nonrheumatic mitral (valve) prolapse: Secondary | ICD-10-CM | POA: Diagnosis not present

## 2019-04-14 DIAGNOSIS — I429 Cardiomyopathy, unspecified: Secondary | ICD-10-CM | POA: Diagnosis not present

## 2019-04-18 ENCOUNTER — Ambulatory Visit: Payer: Self-pay | Admitting: Cardiovascular Disease

## 2019-10-14 ENCOUNTER — Encounter: Payer: BLUE CROSS/BLUE SHIELD | Admitting: Family Medicine

## 2019-10-24 ENCOUNTER — Encounter: Payer: Self-pay | Admitting: Family Medicine

## 2019-10-24 ENCOUNTER — Other Ambulatory Visit: Payer: Self-pay

## 2019-10-24 ENCOUNTER — Ambulatory Visit (INDEPENDENT_AMBULATORY_CARE_PROVIDER_SITE_OTHER): Payer: BC Managed Care – PPO | Admitting: Family Medicine

## 2019-10-24 VITALS — BP 130/90 | HR 80 | Ht 70.0 in | Wt 239.0 lb

## 2019-10-24 DIAGNOSIS — Z8249 Family history of ischemic heart disease and other diseases of the circulatory system: Secondary | ICD-10-CM

## 2019-10-24 DIAGNOSIS — Z6833 Body mass index (BMI) 33.0-33.9, adult: Secondary | ICD-10-CM

## 2019-10-24 DIAGNOSIS — E6609 Other obesity due to excess calories: Secondary | ICD-10-CM | POA: Diagnosis not present

## 2019-10-24 DIAGNOSIS — Z0000 Encounter for general adult medical examination without abnormal findings: Secondary | ICD-10-CM | POA: Diagnosis not present

## 2019-10-24 DIAGNOSIS — E7801 Familial hypercholesterolemia: Secondary | ICD-10-CM | POA: Diagnosis not present

## 2019-10-24 NOTE — Patient Instructions (Signed)

## 2019-10-24 NOTE — Progress Notes (Signed)
Date:  10/24/2019   Name:  Kevin Mendoza   DOB:  22-Feb-1986   MRN:  [id number]   Chief Complaint: Annual Exam  Patient is a 33 year old male who presents for a comprehensive physical exam. The patient reports the following problems: weight concern/. Health maintenance has been reviewed weight loss   Review of Systems  Constitutional: Negative for chills and fever.  HENT: Negative for drooling, ear discharge, ear pain and sore throat.   Respiratory: Negative for cough, shortness of breath and wheezing.   Cardiovascular: Negative for chest pain, palpitations and leg swelling.  Gastrointestinal: Negative for abdominal pain, blood in stool, constipation, diarrhea and nausea.  Endocrine: Negative for polydipsia.  Genitourinary: Negative for dysuria, frequency, hematuria and urgency.  Musculoskeletal: Negative for back pain, myalgias and neck pain.  Skin: Negative for rash.  Allergic/Immunologic: Negative for environmental allergies.  Neurological: Negative for dizziness and headaches.  Hematological: Does not bruise/bleed easily.  Psychiatric/Behavioral: Negative for suicidal ideas. The patient is not nervous/anxious.     There are no active problems to display for this patient.   Allergies  Allergen Reactions  . Amoxicillin     Past Surgical History:  Procedure Laterality Date  . BACK SURGERY  2014    Social History   Tobacco Use  . Smoking status: Former Smoker    Types: Cigarettes  . Smokeless tobacco: Never Used  Substance Use Topics  . Alcohol use: Yes    Comment: occassionally   . Drug use: Never     Medication list has been reviewed and updated.  No outpatient medications have been marked as taking for the 10/24/19 encounter (Office Visit) with Duanne Limerick, MD.    [facility] 2/9 Scores 10/24/2019 07/08/2018  PHQ - 2 Score 0 0  PHQ- 9 Score 2 -    BP Readings from Last 3 Encounters:  10/24/19 130/90  08/28/18 130/82  07/08/18 124/82    Physical Exam  Vitals signs and nursing note reviewed.  Constitutional:      Appearance: Normal appearance. He is well-developed. He is obese.  HENT:     Head: Normocephalic.     Jaw: There is normal jaw occlusion.     Right Ear: Hearing, tympanic membrane, ear canal and external ear normal. There is no impacted cerumen.     Left Ear: Hearing, tympanic membrane, ear canal and external ear normal. There is no impacted cerumen.     Nose: Nose normal. No congestion or rhinorrhea.     Right Turbinates: Not enlarged.     Left Turbinates: Not enlarged.     Mouth/Throat:     Lips: Pink.     Mouth: Mucous membranes are moist. No oral lesions.     Dentition: Normal dentition.     Tongue: No lesions.     Palate: No mass.  Eyes:     General: Lids are normal. Vision grossly intact. Gaze aligned appropriately. No scleral icterus.       Right eye: No discharge.        Left eye: No discharge.     Extraocular Movements: Extraocular movements intact.     Conjunctiva/sclera: Conjunctivae normal.     Pupils: Pupils are equal, round, and reactive to light.  Neck:     Musculoskeletal: Full passive range of motion without pain, normal range of motion and neck supple.     Thyroid: No thyroid mass, thyromegaly or thyroid tenderness.     Vascular: Normal carotid pulses. No  carotid bruit, hepatojugular reflux or JVD.     Trachea: Trachea and phonation normal. No tracheal deviation.  Cardiovascular:     Rate and Rhythm: Normal rate and regular rhythm.     Chest Wall: PMI is not displaced. No thrill.     Pulses: Normal pulses. No decreased pulses.          Carotid pulses are 2+ on the right side and 2+ on the left side.      Radial pulses are 2+ on the right side and 2+ on the left side.       Femoral pulses are 2+ on the right side and 2+ on the left side.      Popliteal pulses are 2+ on the right side and 2+ on the left side.       Dorsalis pedis pulses are 2+ on the right side and 2+ on the left side.       Posterior  tibial pulses are 2+ on the right side and 2+ on the left side.     Heart sounds: Normal heart sounds, S1 normal and S2 normal. No murmur. No systolic murmur. No diastolic murmur. No friction rub. No gallop. No S3 or S4 sounds.   Pulmonary:     Effort: Pulmonary effort is normal. No respiratory distress.     Breath sounds: Normal breath sounds. No decreased breath sounds, wheezing, rhonchi or rales.  Chest:     Chest wall: No mass.     Breasts: Breasts are symmetrical.        Right: Normal.        Left: Normal.  Abdominal:     General: Bowel sounds are normal.     Palpations: Abdomen is soft. There is no hepatomegaly, splenomegaly or mass.     Tenderness: There is no abdominal tenderness. There is no right CVA tenderness, left CVA tenderness, guarding or rebound.     Hernia: There is no hernia in the left inguinal area or right inguinal area.  Genitourinary:    Pubic Area: No rash.      Penis: Normal.      Scrotum/Testes: Normal.        Right: Mass or tenderness not present.        Left: Mass or tenderness not present.     Epididymis:     Right: Normal.     Left: Normal.     Prostate: Normal.     Rectum: Normal.  Musculoskeletal: Normal range of motion.        General: No tenderness.     Cervical back: Normal.     Thoracic back: Normal.     Lumbar back: Normal.     Right lower leg: No edema.     Left lower leg: No edema.  Lymphadenopathy:     Head:     Right side of head: No submental, submandibular or tonsillar adenopathy.     Left side of head: No submental, submandibular or tonsillar adenopathy.     Cervical: No cervical adenopathy.     Right cervical: No superficial, deep or posterior cervical adenopathy.    Left cervical: No superficial, deep or posterior cervical adenopathy.     Upper Body:     Right upper body: No supraclavicular or axillary adenopathy.     Left upper body: No supraclavicular or axillary adenopathy.     Lower Body: No right inguinal adenopathy. No  left inguinal adenopathy.  Skin:    General: Skin is warm.  Capillary Refill: Capillary refill takes less than 2 seconds.     Findings: No rash.  Neurological:     Mental Status: He is alert and oriented to person, place, and time.     Cranial Nerves: Cranial nerves are intact. No cranial nerve deficit.     Sensory: Sensation is intact.     Motor: Motor function is intact.     Deep Tendon Reflexes: Reflexes are normal and symmetric.     Wt Readings from Last 3 Encounters:  10/24/19 239 lb (108.4 kg)  08/28/18 235 lb (106.6 kg)  07/08/18 237 lb (107.5 kg)    BP 130/90   Pulse 80   Ht 5\' 10"  (1.778 m)   Wt 239 lb (108.4 kg)   BMI 34.29 kg/m   Assessment and Plan:  1. Annual physical exam No subjective/objective concerns noted during history and physical.  Continue renal function panel and lipid panel.Kevin Mendoza is a 33 y.o. male who presents today for his Complete Annual Exam. He feels well. He reports exercising . He reports he is sleeping well. Immunizations are reviewed and recommendations provided.   Age appropriate screening tests are discussed. Counseling given for risk factor reduction interventions. - Renal Function Panel - Lipid panel  2. Family history of first-degree relative with cardiomyopathy Patient has a family history of a cardiomyopathy of concern.  There is been some genetic testing and patient needs to reestablish face-to-face with a geneticist.  Will do renal function panel. - Renal Function Panel  3. Familial hypercholesterolemia Chronic.  Controlled.  Will obtain a lipid panel. - Lipid panel  4. Class 1 obesity due to excess calories without serious comorbidity with body mass index (BMI) of 33.0 to 33.9 in adult Mediterranean diet was discussed with patient.

## 2019-10-25 LAB — LIPID PANEL
Chol/HDL Ratio: 5.4 ratio — ABNORMAL HIGH (ref 0.0–5.0)
Cholesterol, Total: 199 mg/dL (ref 100–199)
HDL: 37 mg/dL — ABNORMAL LOW (ref >39)
LDL Chol Calc (NIH): 140 mg/dL — ABNORMAL HIGH (ref 0–99)
Triglycerides: 121 mg/dL (ref 0–149)
VLDL Cholesterol Cal: 22 mg/dL (ref 5–40)

## 2019-10-25 LAB — RENAL FUNCTION PANEL
Albumin: 4.8 g/dL (ref 4.0–5.0)
BUN/Creatinine Ratio: 10 (ref 9–20)
BUN: 10 mg/dL (ref 6–20)
CO2: 24 mmol/L (ref 20–29)
Calcium: 9.5 mg/dL (ref 8.7–10.2)
Chloride: 101 mmol/L (ref 96–106)
Creatinine, Ser: 0.98 mg/dL (ref 0.76–1.27)
GFR calc Af Amer: 117 mL/min/{1.73_m2} (ref >59)
GFR calc non Af Amer: 102 mL/min/{1.73_m2} (ref >59)
Glucose: 86 mg/dL (ref 65–99)
Phosphorus: 3.2 mg/dL (ref 2.8–4.1)
Potassium: 4.7 mmol/L (ref 3.5–5.2)
Sodium: 139 mmol/L (ref 134–144)

## 2019-10-27 ENCOUNTER — Other Ambulatory Visit: Payer: Self-pay

## 2019-10-27 DIAGNOSIS — E782 Mixed hyperlipidemia: Secondary | ICD-10-CM

## 2019-10-27 DIAGNOSIS — E7801 Familial hypercholesterolemia: Secondary | ICD-10-CM

## 2019-10-27 MED ORDER — ATORVASTATIN CALCIUM 10 MG PO TABS: 10.0000 mg | ORAL_TABLET | Freq: Every day | ORAL | 1 refills | Status: DC

## 2019-10-27 NOTE — Progress Notes (Unsigned)
[facility]endoza

## 2019-12-22 DIAGNOSIS — Z03818 Encounter for observation for suspected exposure to other biological agents ruled out: Secondary | ICD-10-CM | POA: Diagnosis not present

## 2020-01-09 DIAGNOSIS — Z20828 Contact with and (suspected) exposure to other viral communicable diseases: Secondary | ICD-10-CM | POA: Diagnosis not present

## 2020-01-12 DIAGNOSIS — Z20828 Contact with and (suspected) exposure to other viral communicable diseases: Secondary | ICD-10-CM | POA: Diagnosis not present

## 2020-01-13 DIAGNOSIS — Z20828 Contact with and (suspected) exposure to other viral communicable diseases: Secondary | ICD-10-CM | POA: Diagnosis not present

## 2020-01-13 DIAGNOSIS — Z03818 Encounter for observation for suspected exposure to other biological agents ruled out: Secondary | ICD-10-CM | POA: Diagnosis not present

## 2020-04-06 DIAGNOSIS — I429 Cardiomyopathy, unspecified: Secondary | ICD-10-CM | POA: Diagnosis not present

## 2020-04-06 DIAGNOSIS — I341 Nonrheumatic mitral (valve) prolapse: Secondary | ICD-10-CM | POA: Diagnosis not present

## 2020-09-20 DIAGNOSIS — Z20822 Contact with and (suspected) exposure to covid-19: Secondary | ICD-10-CM | POA: Diagnosis not present

## 2021-02-25 ENCOUNTER — Other Ambulatory Visit: Payer: Self-pay

## 2021-04-18 DIAGNOSIS — I341 Nonrheumatic mitral (valve) prolapse: Secondary | ICD-10-CM | POA: Diagnosis not present

## 2021-04-18 DIAGNOSIS — I429 Cardiomyopathy, unspecified: Secondary | ICD-10-CM | POA: Diagnosis not present

## 2021-06-08 DIAGNOSIS — I429 Cardiomyopathy, unspecified: Secondary | ICD-10-CM | POA: Diagnosis not present

## 2021-06-08 DIAGNOSIS — I341 Nonrheumatic mitral (valve) prolapse: Secondary | ICD-10-CM | POA: Diagnosis not present

## 2021-06-09 DIAGNOSIS — I429 Cardiomyopathy, unspecified: Secondary | ICD-10-CM | POA: Diagnosis not present

## 2021-06-09 DIAGNOSIS — I341 Nonrheumatic mitral (valve) prolapse: Secondary | ICD-10-CM | POA: Diagnosis not present

## 2021-06-13 DIAGNOSIS — I341 Nonrheumatic mitral (valve) prolapse: Secondary | ICD-10-CM | POA: Diagnosis not present

## 2021-06-13 DIAGNOSIS — I429 Cardiomyopathy, unspecified: Secondary | ICD-10-CM | POA: Diagnosis not present

## 2021-10-03 ENCOUNTER — Encounter: Payer: Self-pay | Admitting: Family Medicine

## 2021-10-03 ENCOUNTER — Ambulatory Visit: Payer: BC Managed Care – PPO | Admitting: Family Medicine

## 2021-10-03 ENCOUNTER — Other Ambulatory Visit: Payer: Self-pay

## 2021-10-03 VITALS — BP 138/90 | HR 68 | Ht 70.0 in | Wt 240.0 lb

## 2021-10-03 DIAGNOSIS — E6609 Other obesity due to excess calories: Secondary | ICD-10-CM

## 2021-10-03 DIAGNOSIS — E782 Mixed hyperlipidemia: Secondary | ICD-10-CM

## 2021-10-03 DIAGNOSIS — Z6833 Body mass index (BMI) 33.0-33.9, adult: Secondary | ICD-10-CM

## 2021-10-03 DIAGNOSIS — I10 Essential (primary) hypertension: Secondary | ICD-10-CM

## 2021-10-03 MED ORDER — LISINOPRIL 5 MG PO TABS: 5.0000 mg | ORAL_TABLET | Freq: Every day | ORAL | 1 refills | Status: DC

## 2021-10-03 NOTE — Patient Instructions (Signed)

## 2021-10-03 NOTE — Progress Notes (Signed)
Date:  10/03/2021   Name:  Kevin Mendoza   DOB:  Jan 27, 1986   MRN:  [id number]   Chief Complaint: Hypertension  Hypertension This is a chronic problem. The current episode started more than 1 year ago. The problem has been gradually improving since onset. The problem is uncontrolled. Pertinent negatives include no blurred vision, chest pain, headaches, neck pain, palpitations or shortness of breath. There are no associated agents to hypertension. Risk factors for coronary artery disease include dyslipidemia. Past treatments include nothing. There is no history of angina, kidney disease, CAD/MI, CVA, heart failure, left ventricular hypertrophy, PVD or retinopathy. cardiomyopathy. There is no history of chronic renal disease, a hypertension causing med or renovascular disease.  Hyperlipidemia This is a chronic problem. The current episode started more than 1 year ago. The problem is uncontrolled. Recent lipid tests were reviewed and are normal. He has no history of chronic renal disease, diabetes, hypothyroidism, liver disease, obesity or nephrotic syndrome. Pertinent negatives include no chest pain, focal sensory loss, focal weakness, leg pain, myalgias or shortness of breath. The current treatment provides moderate improvement of lipids. There are no compliance problems.    Lab Results  Component Value Date   CREATININE 0.98 10/24/2019   BUN 10 10/24/2019   NA 139 10/24/2019   K 4.7 10/24/2019   CL 101 10/24/2019   CO2 24 10/24/2019   Lab Results  Component Value Date   CHOL 199 10/24/2019   HDL 37 (L) 10/24/2019   LDLCALC 140 (H) 10/24/2019   TRIG 121 10/24/2019   CHOLHDL 5.4 (H) 10/24/2019   Lab Results  Component Value Date   TSH 1.250 07/09/2018   No results found for: HGBA1C Lab Results  Component Value Date   WBC 7.9 07/09/2018   HGB 15.1 07/09/2018   HCT 44.7 07/09/2018   MCV 93 07/09/2018   PLT 238 07/09/2018   No results found for: ALT, AST, GGT, ALKPHOS,  BILITOT   Review of Systems  Constitutional:  Negative for chills and fever.  HENT:  Negative for drooling, ear discharge, ear pain and sore throat.   Eyes:  Negative for blurred vision.  Respiratory:  Negative for cough, shortness of breath and wheezing.   Cardiovascular:  Negative for chest pain, palpitations and leg swelling.  Gastrointestinal:  Negative for abdominal pain, blood in stool, constipation, diarrhea and nausea.  Endocrine: Negative for polydipsia.  Genitourinary:  Negative for dysuria, frequency, hematuria and urgency.  Musculoskeletal:  Negative for back pain, myalgias and neck pain.  Skin:  Negative for rash.  Allergic/Immunologic: Negative for environmental allergies.  Neurological:  Negative for dizziness, focal weakness and headaches.  Hematological:  Does not bruise/bleed easily.  Psychiatric/Behavioral:  Negative for suicidal ideas. The patient is not nervous/anxious.    There are no problems to display for this patient.   Allergies  Allergen Reactions   Amoxicillin     Past Surgical History:  Procedure Laterality Date   BACK SURGERY  2014    Social History   Tobacco Use   Smoking status: Former    Types: Cigarettes   Smokeless tobacco: Never  Vaping Use   Vaping Use: Never used  Substance Use Topics   Alcohol use: Yes    Comment: occassionally    Drug use: Never     Medication list has been reviewed and updated.  No outpatient medications have been marked as taking for the 10/03/21 encounter (Office Visit) with Duanne Limerick, MD.  PHQ 2/9 Scores 10/03/2021 10/24/2019 07/08/2018  PHQ - 2 Score 0 0 0  PHQ- 9 Score 2 2 -    GAD 7 : Generalized Anxiety Score 10/03/2021 10/24/2019  Nervous, Anxious, on Edge 0 0  Control/stop worrying 0 0  Worry too much - different things 0 0  Trouble relaxing 2 0  Restless 0 0  Easily annoyed or irritable 0 0  Afraid - awful might happen 0 0  Total GAD 7 Score 2 0  Anxiety Difficulty Not difficult  at all -    BP Readings from Last 3 Encounters:  10/03/21 138/90  10/24/19 130/90  08/28/18 130/82    Physical Exam Vitals and nursing note reviewed.  HENT:     Head: Normocephalic.     Right Ear: Tympanic membrane and external ear normal.     Left Ear: Tympanic membrane and external ear normal.     Nose: Nose normal.     Mouth/Throat:     Mouth: Mucous membranes are moist.  Eyes:     General: No scleral icterus.       Right eye: No discharge.        Left eye: No discharge.     Conjunctiva/sclera: Conjunctivae normal.     Pupils: Pupils are equal, round, and reactive to light.  Neck:     Thyroid: No thyromegaly.     Vascular: No JVD.     Trachea: No tracheal deviation.  Cardiovascular:     Rate and Rhythm: Normal rate and regular rhythm.     Heart sounds: Normal heart sounds. No murmur heard.   No friction rub. No gallop.  Pulmonary:     Effort: No respiratory distress.     Breath sounds: Normal breath sounds. No wheezing, rhonchi or rales.  Abdominal:     General: Bowel sounds are normal.     Palpations: Abdomen is soft. There is no mass.     Tenderness: There is no abdominal tenderness. There is no guarding or rebound.  Musculoskeletal:        General: No tenderness. Normal range of motion.     Cervical back: Normal range of motion and neck supple.  Lymphadenopathy:     Cervical: No cervical adenopathy.  Skin:    General: Skin is warm.     Findings: No rash.  Neurological:     Mental Status: He is alert and oriented to person, place, and time.     Cranial Nerves: No cranial nerve deficit.     Deep Tendon Reflexes: Reflexes are normal and symmetric.    Wt Readings from Last 3 Encounters:  10/03/21 240 lb (108.9 kg)  10/24/19 239 lb (108.4 kg)  08/28/18 235 lb (106.6 kg)    BP 138/90   Pulse 68   Ht 5\' 10"  (1.778 m)   Wt 240 lb (108.9 kg)   BMI 34.44 kg/m   Assessment and Plan: 1. Primary hypertension Chronic.  Persistent.  Uncontrolled.  Patient  was noted 2 years ago have elevated blood pressure as it was not cardiology in June of this year.  Patient's blood pressure is still elevated and we have discussed that it would be prudent at this time to get started on something and we will begin lisinopril 10 mg once a day.  With a recheck in 6 weeks. - Renal Function Panel  2. Mixed hyperlipidemia Chronic.  Uncontrolled.  Stable.  Lipid was noted to be elevated 2 years ago patient was suggested to go on  lipid management medication but did not follow-up.  At this point time we will recheck a lipid panel and if elevated given his cardiomyopathy I think it would be in his best interest that we consider medication control of lipid. - Lipid Panel With LDL/HDL Ratio   3. BMI 30 to 34.9 patient has been given dietary guidelines for decreasing lipid and have had discussions that there is no quick fix with medication and as a matter of dropping down to a 1500 -calorie/day diet.

## 2021-10-04 ENCOUNTER — Other Ambulatory Visit: Payer: Self-pay

## 2021-10-04 DIAGNOSIS — E782 Mixed hyperlipidemia: Secondary | ICD-10-CM

## 2021-10-04 LAB — RENAL FUNCTION PANEL
Albumin: 4.5 g/dL (ref 4.0–5.0)
BUN/Creatinine Ratio: 11 (ref 9–20)
BUN: 10 mg/dL (ref 6–20)
CO2: 22 mmol/L (ref 20–29)
Calcium: 9 mg/dL (ref 8.7–10.2)
Chloride: 102 mmol/L (ref 96–106)
Creatinine, Ser: 0.92 mg/dL (ref 0.76–1.27)
Glucose: 94 mg/dL (ref 70–99)
Phosphorus: 2.5 mg/dL — ABNORMAL LOW (ref 2.8–4.1)
Potassium: 4.6 mmol/L (ref 3.5–5.2)
Sodium: 138 mmol/L (ref 134–144)
eGFR: 112 mL/min/{1.73_m2} (ref >59)

## 2021-10-04 LAB — LIPID PANEL WITH LDL/HDL RATIO
Cholesterol, Total: 173 mg/dL (ref 100–199)
HDL: 35 mg/dL — ABNORMAL LOW (ref >39)
LDL Chol Calc (NIH): 119 mg/dL — ABNORMAL HIGH (ref 0–99)
LDL/HDL Ratio: 3.4 ratio (ref 0.0–3.6)
Triglycerides: 101 mg/dL (ref 0–149)
VLDL Cholesterol Cal: 19 mg/dL (ref 5–40)

## 2021-10-04 MED ORDER — ATORVASTATIN CALCIUM 10 MG PO TABS: 10.0000 mg | ORAL_TABLET | Freq: Every day | ORAL | 1 refills | Status: DC

## 2021-10-04 NOTE — Progress Notes (Signed)
Sent in atorv [facility] 

## 2021-11-14 ENCOUNTER — Ambulatory Visit: Payer: BC Managed Care – PPO | Admitting: Family Medicine

## 2021-11-22 ENCOUNTER — Ambulatory Visit: Payer: BC Managed Care – PPO | Admitting: Family Medicine

## 2021-11-28 ENCOUNTER — Ambulatory Visit: Payer: BC Managed Care – PPO | Admitting: Family Medicine

## 2021-12-27 ENCOUNTER — Ambulatory Visit: Payer: BC Managed Care – PPO | Admitting: Family Medicine

## 2021-12-27 ENCOUNTER — Other Ambulatory Visit: Payer: Self-pay

## 2021-12-27 ENCOUNTER — Encounter: Payer: Self-pay | Admitting: Family Medicine

## 2021-12-27 VITALS — BP 124/86 | HR 64 | Ht 70.0 in | Wt 231.0 lb

## 2021-12-27 DIAGNOSIS — I10 Essential (primary) hypertension: Secondary | ICD-10-CM | POA: Diagnosis not present

## 2021-12-27 DIAGNOSIS — E782 Mixed hyperlipidemia: Secondary | ICD-10-CM | POA: Diagnosis not present

## 2021-12-27 MED ORDER — ATORVASTATIN CALCIUM 10 MG PO TABS: 10.0000 mg | ORAL_TABLET | Freq: Every day | ORAL | 1 refills | Status: AC

## 2021-12-27 MED ORDER — LISINOPRIL 5 MG PO TABS: 5.0000 mg | ORAL_TABLET | Freq: Every day | ORAL | 1 refills | Status: DC

## 2021-12-27 NOTE — Progress Notes (Signed)
Date:  12/27/2021   Name:  Kevin Mendoza   DOB:  12-07-1986   MRN:  [id number]   Chief Complaint: Hypertension and Hyperlipidemia  Hypertension This is a chronic problem. The current episode started more than 1 year ago. The problem has been gradually improving since onset. The problem is controlled. Pertinent negatives include no anxiety, blurred vision, chest pain, headaches, malaise/fatigue, neck pain, orthopnea, palpitations, peripheral edema, PND, shortness of breath or sweats. There are no associated agents to hypertension. The current treatment provides moderate improvement. There are no compliance problems.  There is no history of angina, kidney disease, CAD/MI, CVA, heart failure, left ventricular hypertrophy, PVD or retinopathy. There is no history of chronic renal disease, a hypertension causing med or renovascular disease.  Hyperlipidemia This is a chronic problem. The current episode started more than 1 year ago. The problem is controlled. Recent lipid tests were reviewed and are normal. He has no history of chronic renal disease. Pertinent negatives include no chest pain, focal sensory loss, focal weakness, leg pain, myalgias or shortness of breath. Current antihyperlipidemic treatment includes statins. The current treatment provides moderate improvement of lipids.   Lab Results  Component Value Date   NA 138 10/03/2021   K 4.6 10/03/2021   CO2 22 10/03/2021   GLUCOSE 94 10/03/2021   BUN 10 10/03/2021   CREATININE 0.92 10/03/2021   CALCIUM 9.0 10/03/2021   EGFR 112 10/03/2021   GFRNONAA 102 10/24/2019   Lab Results  Component Value Date   CHOL 173 10/03/2021   HDL 35 (L) 10/03/2021   LDLCALC 119 (H) 10/03/2021   TRIG 101 10/03/2021   CHOLHDL 5.4 (H) 10/24/2019   Lab Results  Component Value Date   TSH 1.250 07/09/2018   No results found for: HGBA1C Lab Results  Component Value Date   WBC 7.9 07/09/2018   HGB 15.1 07/09/2018   HCT 44.7 07/09/2018   MCV 93  07/09/2018   PLT 238 07/09/2018   No results found for: ALT, AST, GGT, ALKPHOS, BILITOT No results found for: 25OHVITD2, 25OHVITD3, VD25OH   Review of Systems  Constitutional:  Negative for chills, fever and malaise/fatigue.  HENT:  Negative for drooling, ear discharge, ear pain and sore throat.   Eyes:  Negative for blurred vision.  Respiratory:  Negative for cough, shortness of breath and wheezing.   Cardiovascular:  Negative for chest pain, palpitations, orthopnea, leg swelling and PND.  Gastrointestinal:  Negative for abdominal pain, blood in stool, constipation, diarrhea and nausea.  Endocrine: Negative for polydipsia.  Genitourinary:  Negative for dysuria, frequency, hematuria and urgency.  Musculoskeletal:  Negative for back pain, myalgias and neck pain.  Skin:  Negative for rash.  Allergic/Immunologic: Negative for environmental allergies.  Neurological:  Negative for dizziness, focal weakness and headaches.  Hematological:  Does not bruise/bleed easily.  Psychiatric/Behavioral:  Negative for suicidal ideas. The patient is not nervous/anxious.    There are no problems to display for this patient.   Allergies  Allergen Reactions   Amoxicillin     Past Surgical History:  Procedure Laterality Date   BACK SURGERY  2014    Social History   Tobacco Use   Smoking status: Former    Types: Cigarettes   Smokeless tobacco: Never  Vaping Use   Vaping Use: Never used  Substance Use Topics   Alcohol use: Yes    Comment: occassionally    Drug use: Never     Medication list has been reviewed and  updated.  Current Meds  Medication Sig   atorvastatin (LIPITOR) 10 MG tablet Take 1 tablet (10 mg total) by mouth daily.   lisinopril (ZESTRIL) 5 MG tablet Take 1 tablet (5 mg total) by mouth daily.    PHQ 2/9 Scores 12/27/2021 10/03/2021 10/24/2019 07/08/2018  PHQ - 2 Score 0 0 0 0  PHQ- 9 Score 0 2 2 -    GAD 7 : Generalized Anxiety Score 12/27/2021 10/03/2021 10/24/2019   Nervous, Anxious, on Edge 0 0 0  Control/stop worrying 0 0 0  Worry too much - different things 0 0 0  Trouble relaxing 0 2 0  Restless 0 0 0  Easily annoyed or irritable 0 0 0  Afraid - awful might happen 0 0 0  Total GAD 7 Score 0 2 0  Anxiety Difficulty Not difficult at all Not difficult at all -    BP Readings from Last 3 Encounters:  12/27/21 124/86  10/03/21 138/90  10/24/19 130/90    Physical Exam Vitals and nursing note reviewed.  HENT:     Head: Normocephalic.     Right Ear: External ear normal.     Left Ear: External ear normal.     Nose: Nose normal. No rhinorrhea.     Mouth/Throat:     Mouth: Mucous membranes are moist.  Eyes:     General: No scleral icterus.       Right eye: No discharge.        Left eye: No discharge.     Conjunctiva/sclera: Conjunctivae normal.     Pupils: Pupils are equal, round, and reactive to light.  Neck:     Thyroid: No thyromegaly.     Vascular: No JVD.     Trachea: No tracheal deviation.  Cardiovascular:     Rate and Rhythm: Normal rate and regular rhythm.     Heart sounds: Normal heart sounds. No murmur heard.   No friction rub. No gallop.  Pulmonary:     Effort: No respiratory distress.     Breath sounds: Normal breath sounds. No stridor. No wheezing, rhonchi or rales.  Abdominal:     General: Bowel sounds are normal.     Palpations: Abdomen is soft. There is no mass.     Tenderness: There is no abdominal tenderness. There is no guarding or rebound.  Musculoskeletal:        General: No tenderness. Normal range of motion.     Cervical back: Normal range of motion and neck supple.  Lymphadenopathy:     Cervical: No cervical adenopathy.  Skin:    General: Skin is warm.     Findings: No rash.  Neurological:     Mental Status: He is alert and oriented to person, place, and time.     Cranial Nerves: No cranial nerve deficit.     Deep Tendon Reflexes: Reflexes are normal and symmetric.    Wt Readings from Last 3  Encounters:  12/27/21 231 lb (104.8 kg)  10/03/21 240 lb (108.9 kg)  10/24/19 239 lb (108.4 kg)    BP 124/86    Pulse 64    Ht '5\' 10"'  (1.778 m)    Wt 231 lb (104.8 kg)    BMI 33.15 kg/m   Assessment and Plan:  1. Mixed hyperlipidemia Chronic.  Controlled.  Stable.  Continue atorvastatin 10 mg once a day.  Will check lipid panel for LDL. - atorvastatin (LIPITOR) 10 MG tablet; Take 1 tablet (10 mg total) by mouth daily.  Dispense: 90 tablet; Refill: 1 - Lipid Panel With LDL/HDL Ratio  2. Primary hypertension Chronic.  Controlled.  Stable.  Blood pressure today is 124/86.  Continue lisinopril 5 mg once a day.  Will check CMP for electrolytes and GFR. - lisinopril (ZESTRIL) 5 MG tablet; Take 1 tablet (5 mg total) by mouth daily.  Dispense: 90 tablet; Refill: 1 - Comprehensive Metabolic Panel (CMET)

## 2021-12-28 LAB — COMPREHENSIVE METABOLIC PANEL
ALT: 25 IU/L (ref 0–44)
AST: 15 IU/L (ref 0–40)
Albumin/Globulin Ratio: 1.9 (ref 1.2–2.2)
Albumin: 5 g/dL (ref 4.0–5.0)
Alkaline Phosphatase: 113 IU/L (ref 44–121)
BUN/Creatinine Ratio: 10 (ref 9–20)
BUN: 8 mg/dL (ref 6–20)
Bilirubin Total: 0.6 mg/dL (ref 0.0–1.2)
CO2: 24 mmol/L (ref 20–29)
Calcium: 9.4 mg/dL (ref 8.7–10.2)
Chloride: 101 mmol/L (ref 96–106)
Creatinine, Ser: 0.82 mg/dL (ref 0.76–1.27)
Globulin, Total: 2.6 g/dL (ref 1.5–4.5)
Glucose: 81 mg/dL (ref 70–99)
Potassium: 4.4 mmol/L (ref 3.5–5.2)
Sodium: 141 mmol/L (ref 134–144)
Total Protein: 7.6 g/dL (ref 6.0–8.5)
eGFR: 118 mL/min/{1.73_m2} (ref >59)

## 2021-12-28 LAB — LIPID PANEL WITH LDL/HDL RATIO
Cholesterol, Total: 183 mg/dL (ref 100–199)
HDL: 38 mg/dL — ABNORMAL LOW (ref >39)
LDL Chol Calc (NIH): 126 mg/dL — ABNORMAL HIGH (ref 0–99)
LDL/HDL Ratio: 3.3 ratio (ref 0.0–3.6)
Triglycerides: 106 mg/dL (ref 0–149)
VLDL Cholesterol Cal: 19 mg/dL (ref 5–40)

## 2022-01-02 DIAGNOSIS — Z0289 Encounter for other administrative examinations: Secondary | ICD-10-CM

## 2022-01-11 ENCOUNTER — Encounter (INDEPENDENT_AMBULATORY_CARE_PROVIDER_SITE_OTHER): Payer: Self-pay | Admitting: Family Medicine

## 2022-01-11 ENCOUNTER — Other Ambulatory Visit: Payer: Self-pay

## 2022-01-11 ENCOUNTER — Ambulatory Visit (INDEPENDENT_AMBULATORY_CARE_PROVIDER_SITE_OTHER): Payer: BC Managed Care – PPO | Admitting: Family Medicine

## 2022-01-11 VITALS — BP 148/103 | HR 75 | Temp 97.8°F | Ht 71.0 in | Wt 222.0 lb

## 2022-01-11 DIAGNOSIS — E7800 Pure hypercholesterolemia, unspecified: Secondary | ICD-10-CM | POA: Diagnosis not present

## 2022-01-11 DIAGNOSIS — E669 Obesity, unspecified: Secondary | ICD-10-CM

## 2022-01-11 DIAGNOSIS — Z1331 Encounter for screening for depression: Secondary | ICD-10-CM

## 2022-01-11 DIAGNOSIS — R030 Elevated blood-pressure reading, without diagnosis of hypertension: Secondary | ICD-10-CM

## 2022-01-11 DIAGNOSIS — R0602 Shortness of breath: Secondary | ICD-10-CM

## 2022-01-11 DIAGNOSIS — R5383 Other fatigue: Secondary | ICD-10-CM

## 2022-01-11 DIAGNOSIS — Z6831 Body mass index (BMI) 31.0-31.9, adult: Secondary | ICD-10-CM | POA: Diagnosis not present

## 2022-01-11 DIAGNOSIS — E7849 Other hyperlipidemia: Secondary | ICD-10-CM

## 2022-01-11 NOTE — Progress Notes (Signed)
Chief Complaint:   OBESITY Kevin Mendoza (MR# [id number]) is a 36 y.o. male who presents for evaluation and treatment of obesity and related comorbidities. Current BMI is Body mass index is 30.96 kg/m. Kevin Mendoza has been struggling with his weight for many years and has been unsuccessful in either losing weight, maintaining weight loss, or reaching his healthy weight goal.  Kevin Mendoza is currently in the action stage of change and ready to dedicate time achieving and maintaining a healthier weight. Kevin Mendoza is interested in becoming our patient and working on intensive lifestyle modifications including (but not limited to) diet and exercise for weight loss.  Kevin Mendoza's habits were reviewed today and are as follows: his desired weight loss is 22 lbs, he started gaining weight at age 36, his heaviest weight ever was 250 pounds, he is a picky eater and doesn't like to eat healthier foods, he has significant food cravings issues, he skips meals frequently, he is frequently drinking liquids with calories, he frequently makes poor food choices, he has problems with excessive hunger, and he frequently eats larger portions than normal.  Depression Screen Kevin Mendoza's Food and Mood (modified PHQ-9) score was 6.  Depression screen Kevin Mendoza 2/9 01/11/2022  Decreased Interest 2  Down, Depressed, Hopeless 0  PHQ - 2 Score 2  Altered sleeping 0  Tired, decreased energy 2  Change in appetite 0  Feeling bad or failure about yourself  1  Trouble concentrating 1  Moving slowly or fidgety/restless 0  Suicidal thoughts 0  PHQ-9 Score 6  Difficult doing work/chores Somewhat difficult   Subjective:   1. Other fatigue Kevin Mendoza admits to daytime somnolence and admits to waking up still tired. Patent has a history of symptoms of daytime fatigue and morning fatigue. Kevin Mendoza generally gets 6 or 8 hours of sleep per night, and states that he has generally restful sleep. Snoring is present. Apneic episodes are not  present. Epworth Sleepiness Score is 2.  2. SOB (shortness of breath) on exertion Kevin Mendoza notes increasing shortness of breath with exercising and seems to be worsening over time with weight gain. He notes getting out of breath sooner with activity than he used to. This has not gotten worse recently. Kevin Mendoza denies shortness of breath at rest or orthopnea.  3. Elevated blood pressure reading Kevin Mendoza's blood pressure is elevated today, and this may be due to white coat syndrome. He is working on weight loss.  4. Other hyperlipidemia Kevin Mendoza has a history of elevated LDL. He is not on statin, and he is working on weight loss.  Assessment/Plan:   1. Other fatigue Kevin Mendoza does feel that his weight is causing his energy to be lower than it should be. Fatigue may be related to obesity, depression or many other causes. Labs will be ordered, and in the meanwhile, Kevin Mendoza will focus on self care including making healthy food choices, increasing physical activity and focusing on stress reduction.  - CBC with Differential/Platelet - Comprehensive metabolic panel - EKG 12-Lead - Folate - Hemoglobin A1c - Insulin, random - T3, free - T4, free - TSH - Vitamin B12 - VITAMIN D 25 Hydroxy (Vit-D Deficiency, Fractures)  2. SOB (shortness of breath) on exertion Kevin Mendoza does feel that he gets out of breath more easily that he used to when he exercises. Kevin Mendoza's shortness of breath appears to be obesity related and exercise induced. He has agreed to work on weight loss and gradually increase exercise to treat his exercise induced shortness of breath. Will  continue to monitor closely.  3. Elevated blood pressure reading Kevin Mendoza will continue working on healthy weight loss to improve blood pressure control. We will recheck his blood pressure in 2 weeks.  4. Other hyperlipidemia Cardiovascular risk and specific lipid/LDL goals reviewed. We will check labs today. Kevin Mendoza will continue to work on  diet, exercise and weight loss efforts. Orders and follow up as documented in patient record.   - Lipid panel  5. Depression screening Kevin Mendoza had a positive depression screening. Depression is commonly associated with obesity and often results in emotional eating behaviors. We will monitor this closely and work on CBT to help improve the non-hunger eating patterns. Referral to Psychology may be required if no improvement is seen as he continues in our clinic.  6. Obesity with current BMI of 31.0 Kevin Mendoza is currently in the action stage of change and his goal is to continue with weight loss efforts. I recommend Kevin Mendoza begin the structured treatment plan as follows:  He has agreed to keeping a food journal and adhering to recommended goals of 1500-1800 calories and 100+ grams of protein daily.  Exercise goals: No exercise has been prescribed for now, while we concentrate on nutritional changes.   Behavioral modification strategies: increasing lean protein intake and keeping a strict food journal.  He was informed of the importance of frequent follow-up visits to maximize his success with intensive lifestyle modifications for his multiple health conditions. He was informed we would discuss his lab results at his next visit unless there is a critical issue that needs to be addressed sooner. Kevin Mendoza agreed to keep his next visit at the agreed upon time to discuss these results.  Objective:   Blood pressure (!) 148/103, pulse 75, temperature 97.8 F (36.6 C), height 5\' 11"  (1.803 m), weight 222 lb (100.7 kg), SpO2 99 %. Body mass index is 30.96 kg/m.  EKG: Normal sinus rhythm, rate 78 BPM.  Indirect Calorimeter completed today shows a VO2 of 318 and a REE of 2189.  His calculated basal metabolic rate is [id number] thus his basal metabolic rate is better than expected.  General: Cooperative, alert, well developed, in no acute distress. HEENT: Conjunctivae and lids unremarkable. Cardiovascular:  Regular rhythm.  Lungs: Normal work of breathing. Neurologic: No focal deficits.   Lab Results  Component Value Date   CREATININE 0.82 12/27/2021   BUN 8 12/27/2021   NA 141 12/27/2021   K 4.4 12/27/2021   CL 101 12/27/2021   CO2 24 12/27/2021   Lab Results  Component Value Date   ALT 25 12/27/2021   AST 15 12/27/2021   ALKPHOS 113 12/27/2021   BILITOT 0.6 12/27/2021   No results found for: HGBA1C No results found for: INSULIN Lab Results  Component Value Date   TSH 1.250 07/09/2018   Lab Results  Component Value Date   CHOL 183 12/27/2021   HDL 38 (L) 12/27/2021   LDLCALC 126 (H) 12/27/2021   TRIG 106 12/27/2021   CHOLHDL 5.4 (H) 10/24/2019   Lab Results  Component Value Date   WBC 7.9 07/09/2018   HGB 15.1 07/09/2018   HCT 44.7 07/09/2018   MCV 93 07/09/2018   PLT 238 07/09/2018   No results found for: IRON, TIBC, FERRITIN  Attestation Statements:   Reviewed by clinician on day of visit: allergies, medications, problem list, medical history, surgical history, family history, social history, and previous encounter notes.  Time spent on visit including pre-visit chart review and post-visit charting and care was  60 minutes.    I, Burt Knack, am acting as transcriptionist for Quillian Quince, MD.  I have reviewed the above documentation for accuracy and completeness, and I agree with the above. - Quillian Quince, MD

## 2022-01-12 LAB — COMPREHENSIVE METABOLIC PANEL
ALT: 29 IU/L (ref 0–44)
AST: 20 IU/L (ref 0–40)
Albumin/Globulin Ratio: 1.8 (ref 1.2–2.2)
Albumin: 4.8 g/dL (ref 4.0–5.0)
Alkaline Phosphatase: 109 IU/L (ref 44–121)
BUN/Creatinine Ratio: 7 — ABNORMAL LOW (ref 9–20)
BUN: 7 mg/dL (ref 6–20)
Bilirubin Total: 0.6 mg/dL (ref 0.0–1.2)
CO2: 24 mmol/L (ref 20–29)
Calcium: 9.5 mg/dL (ref 8.7–10.2)
Chloride: 99 mmol/L (ref 96–106)
Creatinine, Ser: 0.95 mg/dL (ref 0.76–1.27)
Globulin, Total: 2.7 g/dL (ref 1.5–4.5)
Glucose: 82 mg/dL (ref 70–99)
Potassium: 4.7 mmol/L (ref 3.5–5.2)
Sodium: 139 mmol/L (ref 134–144)
Total Protein: 7.5 g/dL (ref 6.0–8.5)
eGFR: 108 mL/min/{1.73_m2} (ref >59)

## 2022-01-12 LAB — CBC WITH DIFFERENTIAL/PLATELET
Basophils Absolute: 0 10*3/uL (ref 0.0–0.2)
Basos: 0 %
EOS (ABSOLUTE): 0.1 10*3/uL (ref 0.0–0.4)
Eos: 2 %
Hematocrit: 49.5 % (ref 37.5–51.0)
Hemoglobin: 16.9 g/dL (ref 13.0–17.7)
Immature Grans (Abs): 0 10*3/uL (ref 0.0–0.1)
Immature Granulocytes: 0 %
Lymphocytes Absolute: 1.6 10*3/uL (ref 0.7–3.1)
Lymphs: 23 %
MCH: 32.9 pg (ref 26.6–33.0)
MCHC: 34.1 g/dL (ref 31.5–35.7)
MCV: 96 fL (ref 79–97)
Monocytes Absolute: 0.4 10*3/uL (ref 0.1–0.9)
Monocytes: 6 %
Neutrophils Absolute: 4.7 10*3/uL (ref 1.4–7.0)
Neutrophils: 69 %
Platelets: 223 10*3/uL (ref 150–450)
RBC: 5.14 x10E6/uL (ref 4.14–5.80)
RDW: 11.5 % — ABNORMAL LOW (ref 11.6–15.4)
WBC: 6.8 10*3/uL (ref 3.4–10.8)

## 2022-01-12 LAB — T3, FREE: T3, Free: 3.8 pg/mL (ref 2.0–4.4)

## 2022-01-12 LAB — HEMOGLOBIN A1C
Est. average glucose Bld gHb Est-mCnc: 100 mg/dL
Hgb A1c MFr Bld: 5.1 % (ref 4.8–5.6)

## 2022-01-12 LAB — LIPID PANEL
Chol/HDL Ratio: 4.4 ratio (ref 0.0–5.0)
Cholesterol, Total: 180 mg/dL (ref 100–199)
HDL: 41 mg/dL (ref >39)
LDL Chol Calc (NIH): 121 mg/dL — ABNORMAL HIGH (ref 0–99)
Triglycerides: 98 mg/dL (ref 0–149)
VLDL Cholesterol Cal: 18 mg/dL (ref 5–40)

## 2022-01-12 LAB — VITAMIN D 25 HYDROXY (VIT D DEFICIENCY, FRACTURES): Vit D, 25-Hydroxy: 23.2 ng/mL — ABNORMAL LOW (ref 30.0–100.0)

## 2022-01-12 LAB — INSULIN, RANDOM: INSULIN: 12.7 u[IU]/mL (ref 2.6–24.9)

## 2022-01-12 LAB — VITAMIN B12: Vitamin B-12: 355 pg/mL (ref 232–1245)

## 2022-01-12 LAB — FOLATE: Folate: 6.7 ng/mL (ref >3.0)

## 2022-01-12 LAB — T4, FREE: Free T4: 1.24 ng/dL (ref 0.82–1.77)

## 2022-01-12 LAB — TSH: TSH: 1.03 u[IU]/mL (ref 0.450–4.500)

## 2022-01-25 ENCOUNTER — Ambulatory Visit (INDEPENDENT_AMBULATORY_CARE_PROVIDER_SITE_OTHER): Payer: BC Managed Care – PPO | Admitting: Family Medicine

## 2022-06-26 ENCOUNTER — Ambulatory Visit: Payer: BC Managed Care – PPO | Admitting: Family Medicine

## 2022-07-13 DIAGNOSIS — E782 Mixed hyperlipidemia: Secondary | ICD-10-CM | POA: Diagnosis not present

## 2022-07-13 DIAGNOSIS — I429 Cardiomyopathy, unspecified: Secondary | ICD-10-CM | POA: Diagnosis not present

## 2022-07-13 DIAGNOSIS — I341 Nonrheumatic mitral (valve) prolapse: Secondary | ICD-10-CM | POA: Diagnosis not present

## 2022-12-27 ENCOUNTER — Ambulatory Visit: Payer: BC Managed Care – PPO | Admitting: Family Medicine

## 2022-12-29 ENCOUNTER — Encounter: Payer: BC Managed Care – PPO | Admitting: Family Medicine

## 2023-01-01 ENCOUNTER — Encounter: Payer: Self-pay | Admitting: Family Medicine

## 2023-06-08 ENCOUNTER — Ambulatory Visit: Payer: BC Managed Care – PPO | Admitting: Family Medicine

## 2023-06-08 ENCOUNTER — Encounter: Payer: Self-pay | Admitting: Family Medicine

## 2023-06-08 VITALS — BP 142/108 | HR 64 | Ht 71.0 in | Wt 244.0 lb

## 2023-06-08 DIAGNOSIS — E782 Mixed hyperlipidemia: Secondary | ICD-10-CM | POA: Diagnosis not present

## 2023-06-08 DIAGNOSIS — I10 Essential (primary) hypertension: Secondary | ICD-10-CM | POA: Diagnosis not present

## 2023-06-08 MED ORDER — LISINOPRIL 10 MG PO TABS: 10.0000 mg | ORAL_TABLET | Freq: Every day | ORAL | 0 refills | Status: DC

## 2023-06-08 NOTE — Progress Notes (Signed)
Date:  06/08/2023   Name:  Kevin Mendoza   DOB:  01-24-1986   MRN:  [id number]   Chief Complaint: Annual Exam  Hypertension This is a chronic problem. The current episode started more than 1 year ago. The problem is unchanged. The problem is uncontrolled. Pertinent negatives include no anxiety, blurred vision, chest pain, headaches, malaise/fatigue, neck pain, orthopnea, palpitations, peripheral edema, PND, shortness of breath or sweats. There are no associated agents to hypertension. Risk factors for coronary artery disease include dyslipidemia. Past treatments include nothing. Compliance problems: resumption of medication.  There is no history of CAD/MI or CVA. There is no history of chronic renal disease, a hypertension causing med or renovascular disease.  Hyperlipidemia He has no history of chronic renal disease. Pertinent negatives include no chest pain or shortness of breath. He is currently on no antihyperlipidemic treatment. There are no compliance problems.  Risk factors for coronary artery disease include dyslipidemia and hypertension.    Lab Results  Component Value Date   NA 139 01/11/2022   K 4.7 01/11/2022   CO2 24 01/11/2022   GLUCOSE 82 01/11/2022   BUN 7 01/11/2022   CREATININE 0.95 01/11/2022   CALCIUM 9.5 01/11/2022   EGFR 108 01/11/2022   GFRNONAA 102 10/24/2019   Lab Results  Component Value Date   CHOL 180 01/11/2022   HDL 41 01/11/2022   LDLCALC 121 (H) 01/11/2022   TRIG 98 01/11/2022   CHOLHDL 4.4 01/11/2022   Lab Results  Component Value Date   TSH 1.030 01/11/2022   Lab Results  Component Value Date   HGBA1C 5.1 01/11/2022   Lab Results  Component Value Date   WBC 6.8 01/11/2022   HGB 16.9 01/11/2022   HCT 49.5 01/11/2022   MCV 96 01/11/2022   PLT 223 01/11/2022   Lab Results  Component Value Date   ALT 29 01/11/2022   AST 20 01/11/2022   ALKPHOS 109 01/11/2022   BILITOT 0.6 01/11/2022   Lab Results  Component Value Date    VD25OH 23.2 (L) 01/11/2022     Review of Systems  Constitutional:  Negative for fatigue, malaise/fatigue and unexpected weight change.  Eyes:  Negative for blurred vision and visual disturbance.  Respiratory:  Negative for chest tightness, shortness of breath and wheezing.   Cardiovascular:  Negative for chest pain, palpitations, orthopnea and PND.  Gastrointestinal:  Negative for abdominal pain and blood in stool.  Musculoskeletal:  Negative for neck pain.  Neurological:  Negative for headaches.    There are no problems to display for this patient.   Allergies  Allergen Reactions   Amoxicillin     Past Surgical History:  Procedure Laterality Date   BACK SURGERY  2014    Social History   Tobacco Use   Smoking status: Former    Types: Cigarettes    Quit date: 2019    Years since quitting: 5.4   Smokeless tobacco: Never  Vaping Use   Vaping Use: Never used  Substance Use Topics   Alcohol use: Yes    Comment: occassionally    Drug use: Never     Medication list has been reviewed and updated.  No outpatient medications have been marked as taking for the 06/08/23 encounter (Office Visit) with Duanne Limerick, MD.       06/08/2023   10:23 AM 12/27/2021   10:44 AM 10/03/2021    8:00 AM 10/24/2019   10:05 AM  GAD 7 : Generalized Anxiety  Score  Nervous, Anxious, on Edge 0 0 0 0  Control/stop worrying 0 0 0 0  Worry too much - different things 0 0 0 0  Trouble relaxing 0 0 2 0  Restless 0 0 0 0  Easily annoyed or irritable 0 0 0 0  Afraid - awful might happen 0 0 0 0  Total GAD 7 Score 0 0 2 0  Anxiety Difficulty Not difficult at all Not difficult at all Not difficult at all        06/08/2023   10:23 AM 01/11/2022    9:19 AM 12/27/2021   10:44 AM  Depression screen PHQ 2/9  Decreased Interest 0 2 0  Down, Depressed, Hopeless 0 0 0  PHQ - 2 Score 0 2 0  Altered sleeping 0 0 0  Tired, decreased energy 0 2 0  Change in appetite 0 0 0  Feeling bad or failure  about yourself  0 1 0  Trouble concentrating 0 1 0  Moving slowly or fidgety/restless 0 0 0  Suicidal thoughts 0 0 0  PHQ-9 Score 0 6 0  Difficult doing work/chores Not difficult at all Somewhat difficult Not difficult at all    BP Readings from Last 3 Encounters:  06/08/23 (!) 142/108  01/11/22 (!) 148/103  12/27/21 124/86    Physical Exam Vitals and nursing note reviewed.  Constitutional:      Appearance: He is obese.  HENT:     Head: Normocephalic.     Right Ear: Tympanic membrane and external ear normal. There is no impacted cerumen.     Left Ear: Tympanic membrane and external ear normal. There is no impacted cerumen.     Nose: Nose normal. No congestion or rhinorrhea.     Mouth/Throat:     Mouth: Mucous membranes are moist.  Neck:     Thyroid: No thyromegaly.     Vascular: No JVD.     Trachea: No tracheal deviation.  Cardiovascular:     Rate and Rhythm: Normal rate and regular rhythm.     Heart sounds: Normal heart sounds. No murmur heard.    No friction rub. No gallop.  Pulmonary:     Effort: No respiratory distress.     Breath sounds: Normal breath sounds. No wheezing, rhonchi or rales.  Abdominal:     General: Bowel sounds are normal.     Palpations: Abdomen is soft.  Musculoskeletal:        General: No tenderness. Normal range of motion.     Cervical back: Neck supple.  Lymphadenopathy:     Cervical: No cervical adenopathy.  Skin:    General: Skin is warm.     Findings: No rash.  Neurological:     Mental Status: He is alert.     Deep Tendon Reflexes:     Reflex Scores:      Patellar reflexes are 2+ on the right side and 2+ on the left side.    Wt Readings from Last 3 Encounters:  01/11/22 222 lb (100.7 kg)  12/27/21 231 lb (104.8 kg)  10/03/21 240 lb (108.9 kg)    BP (!) 142/108 (BP Location: Right Arm, Cuff Size: Large)   Pulse 64   SpO2 97%   Assessment and Plan:  1. Primary hypertension Chronic.  Controlled.  Stable.  Asymptomatic.   Will initiate lisinopril 10 mg once a day.  Side effects and mechanism of action discussed.  Will recheck patient in 6 weeks for physical at which  time blood pressure will be obtained and determination of any further if involvement of medication will be determined. - lisinopril (ZESTRIL) 10 MG tablet; Take 1 tablet (10 mg total) by mouth daily.  Dispense: 60 tablet; Refill: 0  2. Mixed hyperlipidemia Chronic.  Uncontrolled.  Stable.  Previously elevated LDLs with barely adequate HDL.  Patient has been given a low-cholesterol low triglyceride dietary guidelines and we will proceed with evaluation in 6 weeks at which time we will determine if medication such as statin will be necessary.   Elizabeth Sauer, MD

## 2023-06-08 NOTE — Patient Instructions (Signed)

## 2023-07-20 ENCOUNTER — Encounter: Payer: BC Managed Care – PPO | Admitting: Family Medicine

## 2023-07-31 DIAGNOSIS — I341 Nonrheumatic mitral (valve) prolapse: Secondary | ICD-10-CM | POA: Diagnosis not present

## 2023-07-31 DIAGNOSIS — E782 Mixed hyperlipidemia: Secondary | ICD-10-CM | POA: Diagnosis not present

## 2023-07-31 DIAGNOSIS — I10 Essential (primary) hypertension: Secondary | ICD-10-CM | POA: Diagnosis not present

## 2023-08-04 ENCOUNTER — Other Ambulatory Visit: Payer: Self-pay | Admitting: Family Medicine

## 2023-08-04 DIAGNOSIS — I10 Essential (primary) hypertension: Secondary | ICD-10-CM

## 2023-08-06 NOTE — Telephone Encounter (Signed)
Requested Prescriptions  Pending Prescriptions Disp Refills   lisinopril (ZESTRIL) 10 MG tablet [Pharmacy Med Name: LISINOPRIL 10 MG TABLET] 90 tablet 0    Sig: TAKE 1 TABLET BY MOUTH EVERY DAY     Cardiovascular:  ACE Inhibitors Failed - 08/04/2023  1:05 AM      Failed - Cr in normal range and within 180 days    Creatinine, Ser  Date Value Ref Range Status  01/11/2022 0.95 0.76 - 1.27 mg/dL Final         Failed - K in normal range and within 180 days    Potassium  Date Value Ref Range Status  01/11/2022 4.7 3.5 - 5.2 mmol/L Final         Failed - Last BP in normal range    BP Readings from Last 1 Encounters:  06/08/23 (!) 142/108         Passed - Patient is not pregnant      Passed - Valid encounter within last 6 months    Recent Outpatient Visits           1 month ago Primary hypertension   [facility]   1 year ago Primary hypertension   [place] [facility]   1 year ago Primary hypertension   [place] [facility]   3 years ago Annual physical exam   [facility]   4 years ago Annual physical exam   [facility]       Future Appointments             In 1 week Duanne Limerick, MD [facility] at [facility]

## 2023-08-14 ENCOUNTER — Encounter: Payer: BC Managed Care – PPO | Admitting: Family Medicine

## 2023-08-27 DIAGNOSIS — I110 Hypertensive heart disease with heart failure: Secondary | ICD-10-CM | POA: Diagnosis not present

## 2023-08-27 DIAGNOSIS — Z79899 Other long term (current) drug therapy: Secondary | ICD-10-CM | POA: Diagnosis not present

## 2023-08-27 DIAGNOSIS — E782 Mixed hyperlipidemia: Secondary | ICD-10-CM | POA: Diagnosis not present

## 2023-08-27 DIAGNOSIS — Z91148 Patient's other noncompliance with medication regimen for other reason: Secondary | ICD-10-CM | POA: Diagnosis not present

## 2023-08-27 DIAGNOSIS — Z6835 Body mass index (BMI) 35.0-35.9, adult: Secondary | ICD-10-CM | POA: Diagnosis not present

## 2023-08-27 DIAGNOSIS — E785 Hyperlipidemia, unspecified: Secondary | ICD-10-CM | POA: Diagnosis not present

## 2023-08-27 DIAGNOSIS — I5022 Chronic systolic (congestive) heart failure: Secondary | ICD-10-CM | POA: Diagnosis not present

## 2023-08-27 DIAGNOSIS — I341 Nonrheumatic mitral (valve) prolapse: Secondary | ICD-10-CM | POA: Diagnosis not present

## 2023-08-27 DIAGNOSIS — E669 Obesity, unspecified: Secondary | ICD-10-CM | POA: Diagnosis not present

## 2023-09-28 ENCOUNTER — Encounter: Payer: BC Managed Care – PPO | Admitting: Family Medicine

## 2023-12-14 DIAGNOSIS — I341 Nonrheumatic mitral (valve) prolapse: Secondary | ICD-10-CM | POA: Diagnosis not present

## 2023-12-14 DIAGNOSIS — E782 Mixed hyperlipidemia: Secondary | ICD-10-CM | POA: Diagnosis not present

## 2023-12-14 DIAGNOSIS — I5022 Chronic systolic (congestive) heart failure: Secondary | ICD-10-CM | POA: Diagnosis not present

## 2024-01-01 ENCOUNTER — Encounter: Payer: BC Managed Care – PPO | Admitting: Family Medicine

## 2024-07-09 ENCOUNTER — Telehealth: Payer: Self-pay | Admitting: Family Medicine

## 2024-07-09 NOTE — Telephone Encounter (Signed)
 Copied from CRM [id number]. 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 Checking status if we rcvd and are we sending the records: [phone] ext [phone] >> Jul 03, 2024  1:10 PM Alan DEL wrote: The company [employer] will need to send over a release of information form with requested information

## 2024-09-10 ENCOUNTER — Ambulatory Visit: Payer: Self-pay

## 2024-10-08 ENCOUNTER — Ambulatory Visit: Payer: Self-pay
